# Patient Record
Sex: Female | Born: 1980 | Race: White | Hispanic: No | Marital: Single | Smoking: Current every day smoker
Health system: Southern US, Community
[De-identification: ages and names within clinical notes are randomized; demographics above are authoritative.]

## PROBLEM LIST (undated history)

## (undated) DIAGNOSIS — N390 Urinary tract infection, site not specified: Secondary | ICD-10-CM

## (undated) HISTORY — PX: WISDOM TOOTH EXTRACTION: SHX21

## (undated) HISTORY — PX: APPENDECTOMY: SHX54

## (undated) HISTORY — DX: Urinary tract infection, site not specified: N39.0

---

## 2008-07-12 ENCOUNTER — Ambulatory Visit: Payer: Self-pay | Admitting: Emergency Medicine

## 2013-07-07 ENCOUNTER — Emergency Department: Payer: Self-pay | Admitting: Emergency Medicine

## 2014-10-30 ENCOUNTER — Encounter (INDEPENDENT_AMBULATORY_CARE_PROVIDER_SITE_OTHER): Payer: Self-pay

## 2014-10-30 ENCOUNTER — Ambulatory Visit (INDEPENDENT_AMBULATORY_CARE_PROVIDER_SITE_OTHER): Payer: BLUE CROSS/BLUE SHIELD | Admitting: Nurse Practitioner

## 2014-10-30 ENCOUNTER — Encounter: Payer: Self-pay | Admitting: Nurse Practitioner

## 2014-10-30 ENCOUNTER — Other Ambulatory Visit (HOSPITAL_COMMUNITY)
Admission: RE | Admit: 2014-10-30 | Discharge: 2014-10-30 | Disposition: A | Discharge status: Home / Self Care | Payer: BLUE CROSS/BLUE SHIELD | Source: Ambulatory Visit | Attending: Nurse Practitioner | Admitting: Nurse Practitioner

## 2014-10-30 VITALS — BP 110/80 | HR 75 | Temp 97.7°F | Ht 67.0 in | Wt 207.8 lb

## 2014-10-30 DIAGNOSIS — Z01419 Encounter for gynecological examination (general) (routine) without abnormal findings: Secondary | ICD-10-CM | POA: Diagnosis not present

## 2014-10-30 DIAGNOSIS — Z1151 Encounter for screening for human papillomavirus (HPV): Secondary | ICD-10-CM | POA: Insufficient documentation

## 2014-10-30 DIAGNOSIS — Z23 Encounter for immunization: Secondary | ICD-10-CM

## 2014-10-30 DIAGNOSIS — F411 Generalized anxiety disorder: Secondary | ICD-10-CM | POA: Insufficient documentation

## 2014-10-30 DIAGNOSIS — Z1329 Encounter for screening for other suspected endocrine disorder: Secondary | ICD-10-CM | POA: Diagnosis not present

## 2014-10-30 DIAGNOSIS — Z131 Encounter for screening for diabetes mellitus: Secondary | ICD-10-CM

## 2014-10-30 DIAGNOSIS — Z130 Encounter for screening for diseases of the blood and blood-forming organs and certain disorders involving the immune mechanism: Secondary | ICD-10-CM

## 2014-10-30 DIAGNOSIS — Z0000 Encounter for general adult medical examination without abnormal findings: Secondary | ICD-10-CM

## 2014-10-30 DIAGNOSIS — Z7689 Persons encountering health services in other specified circumstances: Secondary | ICD-10-CM | POA: Insufficient documentation

## 2014-10-30 DIAGNOSIS — Z13220 Encounter for screening for lipoid disorders: Secondary | ICD-10-CM

## 2014-10-30 LAB — CBC WITH DIFFERENTIAL/PLATELET
BASOS ABS: 0 10*3/uL (ref 0.0–0.1)
Basophils Relative: 0.5 % (ref 0.0–3.0)
EOS PCT: 2.4 % (ref 0.0–5.0)
Eosinophils Absolute: 0.1 10*3/uL (ref 0.0–0.7)
HEMATOCRIT: 37.3 % (ref 36.0–46.0)
Hemoglobin: 12.8 g/dL (ref 12.0–15.0)
Lymphocytes Relative: 34.7 % (ref 12.0–46.0)
Lymphs Abs: 2.1 10*3/uL (ref 0.7–4.0)
MCHC: 34.3 g/dL (ref 30.0–36.0)
MCV: 90.3 fl (ref 78.0–100.0)
MONO ABS: 0.4 10*3/uL (ref 0.1–1.0)
Monocytes Relative: 7.3 % (ref 3.0–12.0)
NEUTROS PCT: 55.1 % (ref 43.0–77.0)
Neutro Abs: 3.4 10*3/uL (ref 1.4–7.7)
PLATELETS: 314 10*3/uL (ref 150.0–400.0)
RBC: 4.12 Mil/uL (ref 3.87–5.11)
RDW: 14.3 % (ref 11.5–15.5)
WBC: 6.1 10*3/uL (ref 4.0–10.5)

## 2014-10-30 LAB — COMPREHENSIVE METABOLIC PANEL
ALBUMIN: 4.3 g/dL (ref 3.5–5.2)
ALT: 44 U/L — ABNORMAL HIGH (ref 0–35)
AST: 40 U/L — AB (ref 0–37)
Alkaline Phosphatase: 81 U/L (ref 39–117)
BUN: 11 mg/dL (ref 6–23)
CO2: 24 meq/L (ref 19–32)
CREATININE: 0.81 mg/dL (ref 0.40–1.20)
Calcium: 9.2 mg/dL (ref 8.4–10.5)
Chloride: 105 mEq/L (ref 96–112)
GFR: 87.26 mL/min (ref >60.00)
GLUCOSE: 86 mg/dL (ref 70–99)
Potassium: 3.9 mEq/L (ref 3.5–5.1)
Sodium: 138 mEq/L (ref 135–145)
Total Bilirubin: 0.3 mg/dL (ref 0.2–1.2)
Total Protein: 7.6 g/dL (ref 6.0–8.3)

## 2014-10-30 LAB — LIPID PANEL
CHOL/HDL RATIO: 4
CHOLESTEROL: 275 mg/dL — AB (ref 0–200)
HDL: 63.1 mg/dL (ref >39.00)
NonHDL: 211.9
Triglycerides: 296 mg/dL — ABNORMAL HIGH (ref 0.0–149.0)
VLDL: 59.2 mg/dL — ABNORMAL HIGH (ref 0.0–40.0)

## 2014-10-30 LAB — TSH: TSH: 1.41 u[IU]/mL (ref 0.35–4.50)

## 2014-10-30 LAB — LDL CHOLESTEROL, DIRECT: Direct LDL: 145 mg/dL

## 2014-10-30 LAB — HEMOGLOBIN A1C: Hgb A1c MFr Bld: 5.5 % (ref 4.6–6.5)

## 2014-10-30 MED ORDER — PREDNISONE 10 MG (21) PO TBPK
10.0000 mg | ORAL_TABLET | Freq: Every day | ORAL | Status: DC
Start: 2014-10-30 — End: 2015-09-28

## 2014-10-30 NOTE — Progress Notes (Signed)
Subjective:    Patient ID: Stacey Moreno, female    DOB: 12/25/1980, 34 y.o.   MRN: [id number]  HPI  Ms. Stacey Moreno is a 34 yo female establishing care.   1) Health Maintenance-   Diet- Regular   Exercise- No formal, stays active  Immunizations- Needs tdap   Pap- 10 years ago   Eye Exam- Not UTD  Dental Exam- 04/16, normal   LMP- Last week 10/27/14, lasted 5 days normal for pt   Last Labs- 2008   2) Chronic Problems-  Anxiety- Well controlled on Zoloft, 2 years   3) Acute Problems-  Denies acute problems   Review of Systems  Constitutional: Negative for fever, chills, diaphoresis and fatigue.  HENT: Negative for tinnitus and trouble swallowing.   Eyes: Negative for visual disturbance.  Respiratory: Negative for chest tightness, shortness of breath and wheezing.   Cardiovascular: Negative for chest pain, palpitations and leg swelling.  Gastrointestinal: Negative for nausea, vomiting, diarrhea and constipation.  Genitourinary: Negative for difficulty urinating.  Musculoskeletal: Negative for back pain and neck pain.  Skin: Negative for rash.  Neurological: Negative for dizziness, weakness, numbness and headaches.  Hematological: Does not bruise/bleed easily.  Psychiatric/Behavioral: Negative for suicidal ideas and sleep disturbance. The patient is not nervous/anxious.    Past Medical History  Diagnosis Date  . UTI (urinary tract infection)     H/O one    History   Social History  . Marital Status: Single    Spouse Name: N/A  . Number of Children: N/A  . Years of Education: N/A   Occupational History  . Not on file.   Social History Main Topics  . Smoking status: Former Smoker    Types: Cigarettes    Quit date: 06/30/2010  . Smokeless tobacco: Not on file  . Alcohol Use: 6.0 oz/week    10 Cans of beer per week  . Drug Use: No  . Sexual Activity: Not Currently   Other Topics Concern  . 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  Family History  Problem Relation Age of Onset  . Alcohol abuse Father   . Alcohol abuse Maternal Aunt   . Cancer Paternal Aunt     breast  . Cancer Maternal Grandmother     breast  . Cancer Paternal Grandfather     lung    No Known Allergies  No current outpatient prescriptions on file prior to visit.   No current facility-administered medications on file prior to visit.      Objective:   Physical Exam  Constitutional: She is oriented to person, place, and time. She appears well-developed and well-nourished. No distress.  BP 110/80 mmHg  Pulse 75  Temp(Src) 97.7 F (36.5 C) (Oral)  Ht 5\' 7"  (1.702 m)  Wt 207 lb 12 oz (94.235 kg)  BMI 32.53 kg/m2  SpO2 97%  LMP 10/27/2014 (Approximate)   HENT:  Head: Normocephalic and atraumatic.  Right Ear: External ear normal.  Left Ear: External ear normal.  Eyes: EOM are normal. Pupils are equal, round, and reactive to light. Right eye exhibits no discharge. Left eye exhibits no discharge. No scleral icterus.  Neck: Normal  range of motion. Neck supple. No thyromegaly present.  Cardiovascular: Normal rate, regular rhythm, normal heart sounds and intact distal pulses.  Exam reveals no gallop and no friction rub.   No murmur heard. Pulmonary/Chest: Effort normal and breath sounds normal. No respiratory distress. She has no wheezes. She has no rales. She exhibits no tenderness.  Normal breast exam  Abdominal: Soft. Bowel sounds are normal. She exhibits no distension and no mass. There is no tenderness. There is no rebound and no guarding.  Obese abdomen  Genitourinary: Vagina normal and uterus normal. Guaiac negative stool. No vaginal discharge found.  Musculoskeletal: Normal range of motion. She exhibits no edema or tenderness.  Lymphadenopathy:     She has no cervical adenopathy.  Neurological: She is alert and oriented to person, place, and time. No cranial nerve deficit. She exhibits normal muscle tone. Coordination normal.  Skin: Skin is warm and dry. No rash noted. She is not diaphoretic.  Psychiatric: She has a normal mood and affect. Her behavior is normal. Judgment and thought content normal.      Assessment & Plan:

## 2014-10-30 NOTE — Addendum Note (Signed)
Addended by: Karlene Einstein D on: 10/30/2014 04:34 PM   Modules accepted: Orders

## 2014-10-30 NOTE — Assessment & Plan Note (Signed)
Seeing Dr. Nicolasa Ducking. Would like for [place] to fill Zoloft. Dr. Nicolasa Ducking referred her to [place].

## 2014-10-30 NOTE — Assessment & Plan Note (Signed)
New pt. No CC so turned into physical exam

## 2014-10-30 NOTE — Patient Instructions (Signed)

## 2014-10-30 NOTE — Progress Notes (Signed)
Pre visit review using our clinic review tool, if applicable. No additional management support is needed unless otherwise documented below in the visit note. 

## 2014-10-30 NOTE — Assessment & Plan Note (Signed)
Discussed acute and chronic issues. Reviewed health maintenance measures, PFSHx, and immunizations. Obtain routine labs TSH, Lipid panel, CBC w/ diff, A1c, and CMET.   Tdap given today.

## 2014-10-30 NOTE — Assessment & Plan Note (Signed)
PAP and breast exam done today. Normal findings

## 2014-11-01 ENCOUNTER — Other Ambulatory Visit: Payer: Self-pay | Admitting: Nurse Practitioner

## 2014-11-01 DIAGNOSIS — Z79899 Other long term (current) drug therapy: Secondary | ICD-10-CM

## 2014-11-01 LAB — CYTOLOGY - PAP

## 2014-11-01 MED ORDER — ATORVASTATIN CALCIUM 10 MG PO TABS: 10.0000 mg | ORAL_TABLET | Freq: Every day | ORAL | Status: DC

## 2014-11-30 ENCOUNTER — Ambulatory Visit: Payer: BLUE CROSS/BLUE SHIELD | Admitting: Nurse Practitioner

## 2014-12-01 ENCOUNTER — Telehealth: Payer: Self-pay | Admitting: Nurse Practitioner

## 2014-12-01 ENCOUNTER — Ambulatory Visit: Payer: BLUE CROSS/BLUE SHIELD | Admitting: Nurse Practitioner

## 2014-12-01 NOTE — Telephone Encounter (Signed)
Thank you. Okay to reschedule.

## 2014-12-01 NOTE — Telephone Encounter (Signed)
Pt called to cancel appt due to the passing of her father. Please advise pt to resch/msn

## 2015-01-28 ENCOUNTER — Other Ambulatory Visit: Payer: Self-pay | Admitting: Nurse Practitioner

## 2015-05-03 ENCOUNTER — Other Ambulatory Visit: Payer: Self-pay | Admitting: Nurse Practitioner

## 2015-06-27 ENCOUNTER — Ambulatory Visit: Payer: Self-pay | Admitting: Physician Assistant

## 2015-09-28 ENCOUNTER — Encounter: Payer: Self-pay | Admitting: Physician Assistant

## 2015-09-28 ENCOUNTER — Ambulatory Visit: Payer: Self-pay | Admitting: Physician Assistant

## 2015-09-28 VITALS — BP 125/80 | HR 76 | Temp 98.5°F

## 2015-09-28 DIAGNOSIS — Z299 Encounter for prophylactic measures, unspecified: Secondary | ICD-10-CM

## 2015-09-28 DIAGNOSIS — K529 Noninfective gastroenteritis and colitis, unspecified: Secondary | ICD-10-CM

## 2015-09-28 DIAGNOSIS — K219 Gastro-esophageal reflux disease without esophagitis: Secondary | ICD-10-CM

## 2015-09-28 DIAGNOSIS — R748 Abnormal levels of other serum enzymes: Secondary | ICD-10-CM

## 2015-09-28 MED ORDER — OMEPRAZOLE 20 MG PO CPDR: 20.0000 mg | DELAYED_RELEASE_CAPSULE | Freq: Every day | ORAL | Status: DC

## 2015-09-28 NOTE — Progress Notes (Signed)
S: having chronic diarrhea, stomach gets upset a lot, sx for about a year, no blood or mucus in the stool, no fever/chills, also having reflux problems, a lot of heart burn, states does still have a gallbladder, denies abd pain, just cramping with diarrhea, mother has IBS  O: vitals wnl, nad, abd soft nontender bs normal all 4 quads, n/v intact  A: chronic diarrhea, gerd  P: omeprazole 20mg  qd, otc probiotic, will refer to GI, yearly labs drawn today

## 2015-09-29 LAB — CMP12+LP+TP+TSH+6AC+CBC/D/PLT
A/G RATIO: 1.7 (ref 1.2–2.2)
ALT: 86 IU/L — AB (ref 0–32)
AST: 93 IU/L — ABNORMAL HIGH (ref 0–40)
Albumin: 4.5 g/dL (ref 3.5–5.5)
Alkaline Phosphatase: 88 IU/L (ref 39–117)
BASOS ABS: 0 10*3/uL (ref 0.0–0.2)
BUN/Creatinine Ratio: 14 (ref 8–20)
BUN: 10 mg/dL (ref 6–20)
Basos: 1 %
Bilirubin Total: <0.2 mg/dL (ref 0.0–1.2)
CALCIUM: 9.5 mg/dL (ref 8.7–10.2)
Chloride: 96 mmol/L (ref 96–106)
Chol/HDL Ratio: 5.8 ratio units — ABNORMAL HIGH (ref 0.0–4.4)
Cholesterol, Total: 272 mg/dL — ABNORMAL HIGH (ref 100–199)
Creatinine, Ser: 0.69 mg/dL (ref 0.57–1.00)
EOS (ABSOLUTE): 0.1 10*3/uL (ref 0.0–0.4)
Eos: 1 %
Estimated CHD Risk: 1.6 times avg. — ABNORMAL HIGH (ref 0.0–1.0)
Free Thyroxine Index: 1.5 (ref 1.2–4.9)
GFR calc non Af Amer: 116 mL/min/{1.73_m2} (ref >59)
GFR, EST AFRICAN AMERICAN: 133 mL/min/{1.73_m2} (ref >59)
GGT: 231 IU/L — ABNORMAL HIGH (ref 0–60)
GLOBULIN, TOTAL: 2.6 g/dL (ref 1.5–4.5)
Glucose: 92 mg/dL (ref 65–99)
HDL: 47 mg/dL (ref >39)
Hematocrit: 40.2 % (ref 34.0–46.6)
Hemoglobin: 13 g/dL (ref 11.1–15.9)
IRON: 99 ug/dL (ref 27–159)
Immature Grans (Abs): 0 10*3/uL (ref 0.0–0.1)
Immature Granulocytes: 0 %
LDH: 241 IU/L — AB (ref 119–226)
LYMPHS: 28 %
Lymphocytes Absolute: 2 10*3/uL (ref 0.7–3.1)
MCH: 30.7 pg (ref 26.6–33.0)
MCHC: 32.3 g/dL (ref 31.5–35.7)
MCV: 95 fL (ref 79–97)
MONOS ABS: 0.4 10*3/uL (ref 0.1–0.9)
Monocytes: 6 %
Neutrophils Absolute: 4.7 10*3/uL (ref 1.4–7.0)
Neutrophils: 64 %
PLATELETS: 287 10*3/uL (ref 150–379)
Phosphorus: 3.1 mg/dL (ref 2.5–4.5)
Potassium: 4.1 mmol/L (ref 3.5–5.2)
RBC: 4.24 x10E6/uL (ref 3.77–5.28)
RDW: 14.1 % (ref 12.3–15.4)
Sodium: 137 mmol/L (ref 134–144)
T3 UPTAKE RATIO: 26 % (ref 24–39)
T4 TOTAL: 5.6 ug/dL (ref 4.5–12.0)
TOTAL PROTEIN: 7.1 g/dL (ref 6.0–8.5)
TRIGLYCERIDES: 827 mg/dL — AB (ref 0–149)
TSH: 1.53 u[IU]/mL (ref 0.450–4.500)
URIC ACID: 6.4 mg/dL (ref 2.5–7.1)
WBC: 7.3 10*3/uL (ref 3.4–10.8)

## 2015-09-29 LAB — HEMOGLOBIN A1C
ESTIMATED AVERAGE GLUCOSE: 111 mg/dL
Hgb A1c MFr Bld: 5.5 % (ref 4.8–5.6)

## 2015-09-29 LAB — VITAMIN D 25 HYDROXY (VIT D DEFICIENCY, FRACTURES): VIT D 25 HYDROXY: 14.4 ng/mL — AB (ref 30.0–100.0)

## 2015-10-01 NOTE — Addendum Note (Signed)
Addended by: Versie Starks on: 10/01/2015 02:54 PM   Modules accepted: Orders

## 2015-10-01 NOTE — Progress Notes (Signed)
Liver enzymes elevated with alert on triglycerides, pt has been having cramping and diarrhea for about a year, order [place] of abdomen and recheck liver enzymes and lipids this week

## 2015-10-01 NOTE — Progress Notes (Signed)
Patient ID: Stacey Moreno, female   DOB: 1980/09/08, 35 y.o.   MRN: [id number] Patient has been scheduled for an abdomen ultrasound on 4/3 at 8:30 in [place].  Pt has been contacted and has accepted the appointment.  I called [employer] to check if PA is needed for the Ultrasound..  Per Ardith Dark at [facility] she said that a prior authorization is not required.  If any questions call [phone].

## 2015-10-01 NOTE — Progress Notes (Signed)
I spoke with the patient about her lab results and she expressed understanding.  Pt will return tomorrow to recheck her liver enzymes and schedule an abdomen ultrasound.

## 2015-10-02 ENCOUNTER — Encounter: Payer: Self-pay | Admitting: Physician Assistant

## 2015-10-02 ENCOUNTER — Ambulatory Visit
Admission: RE | Admit: 2015-10-02 | Discharge: 2015-10-02 | Disposition: A | Discharge status: Home / Self Care | Payer: Managed Care, Other (non HMO) | Source: Ambulatory Visit | Attending: Physician Assistant | Admitting: Physician Assistant

## 2015-10-02 ENCOUNTER — Ambulatory Visit: Payer: Self-pay | Admitting: Physician Assistant

## 2015-10-02 DIAGNOSIS — K529 Noninfective gastroenteritis and colitis, unspecified: Secondary | ICD-10-CM | POA: Diagnosis not present

## 2015-10-02 DIAGNOSIS — K760 Fatty (change of) liver, not elsewhere classified: Secondary | ICD-10-CM

## 2015-10-02 DIAGNOSIS — R748 Abnormal levels of other serum enzymes: Secondary | ICD-10-CM | POA: Insufficient documentation

## 2015-10-02 DIAGNOSIS — E781 Pure hyperglyceridemia: Secondary | ICD-10-CM

## 2015-10-02 NOTE — Progress Notes (Signed)
S: pt here for repeat lab draw of triglycerides and liver enzymes, had triglyceride level of 827 with elevated liver enzymes also, [place] of abdomen showed fatty liver disease, pt is being referred to GI for eval of chronic diarrhea, pt does state she has been drinking a lot of etoh and eating badly, has gained weight also,  O: neuro intact, lab review  A: lab review, [place] review  P: discussed cessation of any etoh, her father was an alcoholic and alcoholism also runs on her mother's side of the family, discussed fatty liver disease, causes and sequelae, went over heart healthy diet options , will review repeat labs tomorrow and discuss options of statin therapy with dr Rosanna Randy

## 2015-10-02 NOTE — Addendum Note (Signed)
Addended by: Rudene Anda T on: 10/02/2015 03:09 PM   Modules accepted: Orders

## 2015-10-03 ENCOUNTER — Telehealth: Payer: Self-pay | Admitting: Gastroenterology

## 2015-10-03 ENCOUNTER — Other Ambulatory Visit: Payer: Self-pay | Admitting: Physician Assistant

## 2015-10-03 LAB — HEPATIC FUNCTION PANEL
ALT: 98 IU/L — AB (ref 0–32)
AST: 100 IU/L — ABNORMAL HIGH (ref 0–40)
Albumin: 4.6 g/dL (ref 3.5–5.5)
Alkaline Phosphatase: 83 IU/L (ref 39–117)
BILIRUBIN TOTAL: 0.3 mg/dL (ref 0.0–1.2)
Bilirubin, Direct: 0.09 mg/dL (ref 0.00–0.40)
TOTAL PROTEIN: 7 g/dL (ref 6.0–8.5)

## 2015-10-03 LAB — HCV COMMENT:

## 2015-10-03 LAB — LIPID PANEL
Chol/HDL Ratio: 3.6 ratio units (ref 0.0–4.4)
Cholesterol, Total: 233 mg/dL — ABNORMAL HIGH (ref 100–199)
HDL: 64 mg/dL (ref >39)
LDL Calculated: 110 mg/dL — ABNORMAL HIGH (ref 0–99)
Triglycerides: 296 mg/dL — ABNORMAL HIGH (ref 0–149)
VLDL Cholesterol Cal: 59 mg/dL — ABNORMAL HIGH (ref 5–40)

## 2015-10-03 LAB — HEPATITIS C ANTIBODY (REFLEX)

## 2015-10-03 MED ORDER — PRAVASTATIN SODIUM 20 MG PO TABS: 20.0000 mg | ORAL_TABLET | Freq: Every day | ORAL | Status: DC

## 2015-10-03 NOTE — Progress Notes (Signed)
Sent in rx for pravastatin 20mg , discussed with dr Rosanna Randy and he recommended this medication with recheck of liver enzymes and lipids in one month

## 2015-10-03 NOTE — Progress Notes (Signed)
I spoke to the patient about her lab results and she expressed understanding.  Patient will return in 3 months to recheck her Liver enzymes and her Lipid panel per Susan's authorization.

## 2015-10-03 NOTE — Telephone Encounter (Signed)
Left voice message for patient to call and schedule with Dr. Allen Norris for chronic diarrhea, reflux, and elevated liver enzymes

## 2015-10-09 ENCOUNTER — Telehealth: Payer: Self-pay | Admitting: Gastroenterology

## 2015-10-09 NOTE — Telephone Encounter (Signed)
Left voice message for patient to call and schedule an appointment with Dr. Allen Norris for chronic diarrhea. 2nd attempt

## 2015-10-17 ENCOUNTER — Telehealth: Payer: Self-pay | Admitting: Gastroenterology

## 2015-10-17 NOTE — Telephone Encounter (Signed)
Called patient and left message on 4/5, 4/11 and 4/19 to schedule appointment with Dr. Allen Norris for chronic diarrhea

## 2015-11-06 ENCOUNTER — Ambulatory Visit: Payer: Self-pay | Admitting: Physician Assistant

## 2015-11-28 ENCOUNTER — Encounter: Payer: Self-pay | Admitting: Physician Assistant

## 2015-11-28 ENCOUNTER — Ambulatory Visit: Payer: Self-pay | Admitting: Physician Assistant

## 2015-11-28 VITALS — BP 120/80 | HR 77 | Temp 98.4°F

## 2015-11-28 DIAGNOSIS — J029 Acute pharyngitis, unspecified: Secondary | ICD-10-CM

## 2015-11-28 NOTE — Progress Notes (Signed)
S: c/o sore throat, runny nose, dry cough started this am, sore throat for 2 days, no fever/chills, just got back from [place], ?if its from climate change; ?pt about chronic diarrhea and states the probiotic has really helped her and has cut back on etoh so diarrhea has subsided  O: vitals wnl, nad, tms dull, nasal mucosa wnl, throat w red area at uvula only, neck supple no lymph, lungs c t a, cv rrr  A: acute viral pharyngitis  P: reassurance, warm salt water gargles, if worsening will call in antibiotic

## 2016-02-12 ENCOUNTER — Other Ambulatory Visit: Payer: Self-pay | Admitting: Physician Assistant

## 2016-02-12 NOTE — Telephone Encounter (Signed)
Med refill approved, will need fasting labs to check lipid profile and liver enzymes prior to additional refills

## 2016-03-18 ENCOUNTER — Ambulatory Visit: Payer: Self-pay | Admitting: Physician Assistant

## 2016-03-19 ENCOUNTER — Ambulatory Visit: Payer: Self-pay | Admitting: Physician Assistant

## 2016-05-02 ENCOUNTER — Ambulatory Visit: Payer: Self-pay | Admitting: Physician Assistant

## 2016-05-02 ENCOUNTER — Other Ambulatory Visit
Admission: RE | Admit: 2016-05-02 | Discharge: 2016-05-02 | Disposition: A | Discharge status: Home / Self Care | Payer: Managed Care, Other (non HMO) | Source: Ambulatory Visit | Attending: Physician Assistant | Admitting: Physician Assistant

## 2016-05-02 ENCOUNTER — Encounter: Payer: Self-pay | Admitting: Physician Assistant

## 2016-05-02 VITALS — BP 122/80 | HR 93 | Temp 98.7°F

## 2016-05-02 DIAGNOSIS — F1010 Alcohol abuse, uncomplicated: Secondary | ICD-10-CM | POA: Diagnosis present

## 2016-05-02 LAB — CBC WITH DIFFERENTIAL/PLATELET
BASOS ABS: 0 10*3/uL (ref 0–0.1)
BASOS PCT: 0 %
EOS ABS: 0.1 10*3/uL (ref 0–0.7)
EOS PCT: 1 %
HCT: 37.1 % (ref 35.0–47.0)
Hemoglobin: 12.9 g/dL (ref 12.0–16.0)
Lymphocytes Relative: 19 %
Lymphs Abs: 1.6 10*3/uL (ref 1.0–3.6)
MCH: 33.9 pg (ref 26.0–34.0)
MCHC: 34.8 g/dL (ref 32.0–36.0)
MCV: 97.4 fL (ref 80.0–100.0)
Monocytes Absolute: 0.4 10*3/uL (ref 0.2–0.9)
Monocytes Relative: 5 %
NEUTROS PCT: 75 %
Neutro Abs: 6.2 10*3/uL (ref 1.4–6.5)
PLATELETS: 196 10*3/uL (ref 150–440)
RBC: 3.8 MIL/uL (ref 3.80–5.20)
RDW: 15.8 % — ABNORMAL HIGH (ref 11.5–14.5)
WBC: 8.4 10*3/uL (ref 3.6–11.0)

## 2016-05-02 LAB — COMPREHENSIVE METABOLIC PANEL
ALT: 32 U/L (ref 14–54)
AST: 46 U/L — ABNORMAL HIGH (ref 15–41)
Albumin: 4 g/dL (ref 3.5–5.0)
Alkaline Phosphatase: 66 U/L (ref 38–126)
Anion gap: 7 (ref 5–15)
BUN: 9 mg/dL (ref 6–20)
CHLORIDE: 106 mmol/L (ref 101–111)
CO2: 23 mmol/L (ref 22–32)
CREATININE: 0.54 mg/dL (ref 0.44–1.00)
Calcium: 9.3 mg/dL (ref 8.9–10.3)
Glucose, Bld: 121 mg/dL — ABNORMAL HIGH (ref 65–99)
POTASSIUM: 3.8 mmol/L (ref 3.5–5.1)
Sodium: 136 mmol/L (ref 135–145)
Total Bilirubin: 0.5 mg/dL (ref 0.3–1.2)
Total Protein: 7.4 g/dL (ref 6.5–8.1)

## 2016-05-02 LAB — TSH: TSH: 2.85 u[IU]/mL (ref 0.350–4.500)

## 2016-05-02 LAB — URINE DRUG SCREEN, QUALITATIVE (ARMC ONLY)
Amphetamines, Ur Screen: NOT DETECTED
Barbiturates, Ur Screen: POSITIVE — AB
Benzodiazepine, Ur Scrn: NOT DETECTED
CANNABINOID 50 NG, UR ~~LOC~~: NOT DETECTED
COCAINE METABOLITE, UR ~~LOC~~: NOT DETECTED
MDMA (ECSTASY) UR SCREEN: NOT DETECTED
Methadone Scn, Ur: NOT DETECTED
Opiate, Ur Screen: NOT DETECTED
Phencyclidine (PCP) Ur S: NOT DETECTED
TRICYCLIC, UR SCREEN: NOT DETECTED

## 2016-05-02 LAB — LIPID PANEL
CHOLESTEROL: 180 mg/dL (ref 0–200)
HDL: 51 mg/dL (ref >40)
LDL Cholesterol: UNDETERMINED mg/dL (ref 0–99)
TRIGLYCERIDES: 470 mg/dL — AB (ref <150)
Total CHOL/HDL Ratio: 3.5 RATIO
VLDL: UNDETERMINED mg/dL (ref 0–40)

## 2016-05-02 LAB — ETHANOL: Alcohol, Ethyl (B): <5 mg/dL (ref <5)

## 2016-05-02 NOTE — Progress Notes (Addendum)
S: wants to talk about detox, states she knows she's an alcoholic, has strong fam hx of alcoholism, recently was reprimanded at work due to having etoh on her breath, blew a .08 , states she knows she needs to work on this, her last drink was Wednesday night, hasn't had any vomiting or tremors since then, states she was drinking 1/2 of a fifth of liquor + beer every day, denies other drug use, recently started smoking again, has family support to help her with rehab, father died of complications from alcoholism, her mother's side of the family has strong history of etoh abuse, + smoker  O:  Vitals wnl, nad, lungs c t a, cv rrr, neuro intact  A: etoh abuse  P: medical clearance with labs, pt sent to [facility] for detox  Pt called, [facility] sent her home with a librium taper, is doing well but needs a name of a counselor, recommended Patton Salles at [facility] therapy or [facility]

## 2016-05-14 ENCOUNTER — Ambulatory Visit (HOSPITAL_COMMUNITY): Payer: Managed Care, Other (non HMO) | Admitting: Psychology

## 2016-05-14 DIAGNOSIS — F1020 Alcohol dependence, uncomplicated: Secondary | ICD-10-CM

## 2016-05-14 NOTE — Progress Notes (Signed)
Orientation to CD-IOP: The patient is a 35 yo single, white, female seeing entry into the CD-IOP. The Chief of Personnel at the [facility] Department referred her to our program. The patient reported she had gone to work on November 1 and someone smelled alcohol on her. A BAC was collected and it registered .08. The patient was temporarily out of work and her PCP referred her to the hospital in [place] for an alcohol detox. They sent her home with a Librium taper and she completed it on Tuesday, November 7.  The patient reported she has had no alcohol since November 1. She will return to work tomorrow, but in a different position for the time being. Her new assignment will accommodate any hours she needs to miss in order to attend the group. The patient was very open about her alcohol use and stated, "I am an alcoholic". She expressed relief that this problem is finally going to be addressed. The patient reported she first drank at age 39 and began drinking more frequently when she entered college at [employer] and played on the softball team. Drinking was a significant part of her college experience and the patient admitted she has been drinking daily for 13 years. 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 The patient is an only child and was born and raised in [place], [place]. Despite both of her parents being alcoholics, the patient described a happy and loving childhood. She and her father were very close and he was very supportive of her athletic accomplishments. He died in January 13, 2015 "from drinking and other things". The patient never mourned his death and admitted she has used alcohol to numb the pain and losses in her life. Her three maternal aunts are also chemically dependent. The oldest died of an opiate overdose (using fentanyl patches) while another has  been sober for 20+ years. The patient knows little of her family history.  She explained that her maternal grandmother killed herself and all four daughters grew up in an orphanage. Her mother refuses to talk about her childhood. She currently lives in [place] with her mother. The patient reported she had been in a toxic relationship with a woman for 5 years. The S/O was manipulative, accused the patient of robbing the home, lied to the police, and was able to secure a restraining order, which is good for the next year. 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 The patient was very revealing and cooperative throughout the session, but admitted she did not typically share her feelings and was disclosing more today in this session than she ever had. She admitted being a "people pleaser" and a stuffer. 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 She understood that this way of dealing with feelings and pain would be address in this program and seemed to welcome it. The documentation was reviewed, signed and the orientation and CCA completed accordingly. The patient will return tomorrow and begin the CD-IOP.

## 2016-05-15 ENCOUNTER — Telehealth (HOSPITAL_COMMUNITY): Payer: Self-pay | Admitting: Psychology

## 2016-05-15 ENCOUNTER — Encounter (HOSPITAL_COMMUNITY): Payer: Self-pay | Admitting: Psychology

## 2016-05-15 ENCOUNTER — Other Ambulatory Visit (HOSPITAL_COMMUNITY): Payer: Managed Care, Other (non HMO) | Attending: Psychiatry | Admitting: Psychology

## 2016-05-15 DIAGNOSIS — K2900 Acute gastritis without bleeding: Secondary | ICD-10-CM | POA: Insufficient documentation

## 2016-05-15 DIAGNOSIS — F4310 Post-traumatic stress disorder, unspecified: Secondary | ICD-10-CM | POA: Diagnosis not present

## 2016-05-15 DIAGNOSIS — Z0001 Encounter for general adult medical examination with abnormal findings: Secondary | ICD-10-CM | POA: Diagnosis present

## 2016-05-15 DIAGNOSIS — K2920 Alcoholic gastritis without bleeding: Secondary | ICD-10-CM | POA: Insufficient documentation

## 2016-05-15 DIAGNOSIS — E669 Obesity, unspecified: Secondary | ICD-10-CM | POA: Diagnosis not present

## 2016-05-15 DIAGNOSIS — F10230 Alcohol dependence with withdrawal, uncomplicated: Secondary | ICD-10-CM | POA: Insufficient documentation

## 2016-05-15 DIAGNOSIS — K7010 Alcoholic hepatitis without ascites: Secondary | ICD-10-CM | POA: Insufficient documentation

## 2016-05-15 DIAGNOSIS — Z6830 Body mass index (BMI) 30.0-30.9, adult: Secondary | ICD-10-CM | POA: Diagnosis not present

## 2016-05-15 DIAGNOSIS — F17210 Nicotine dependence, cigarettes, uncomplicated: Secondary | ICD-10-CM | POA: Diagnosis not present

## 2016-05-15 DIAGNOSIS — E781 Pure hyperglyceridemia: Secondary | ICD-10-CM | POA: Diagnosis not present

## 2016-05-15 DIAGNOSIS — F4329 Adjustment disorder with other symptoms: Secondary | ICD-10-CM | POA: Diagnosis not present

## 2016-05-15 DIAGNOSIS — F19980 Other psychoactive substance use, unspecified with psychoactive substance-induced anxiety disorder: Secondary | ICD-10-CM | POA: Insufficient documentation

## 2016-05-15 DIAGNOSIS — F1020 Alcohol dependence, uncomplicated: Secondary | ICD-10-CM

## 2016-05-15 DIAGNOSIS — F411 Generalized anxiety disorder: Secondary | ICD-10-CM | POA: Insufficient documentation

## 2016-05-19 ENCOUNTER — Other Ambulatory Visit (HOSPITAL_COMMUNITY): Payer: Managed Care, Other (non HMO) | Admitting: Psychology

## 2016-05-19 DIAGNOSIS — F1020 Alcohol dependence, uncomplicated: Secondary | ICD-10-CM

## 2016-05-19 DIAGNOSIS — Z0001 Encounter for general adult medical examination with abnormal findings: Secondary | ICD-10-CM | POA: Diagnosis not present

## 2016-05-20 ENCOUNTER — Encounter (HOSPITAL_COMMUNITY): Payer: Self-pay | Admitting: Psychology

## 2016-05-21 ENCOUNTER — Other Ambulatory Visit (HOSPITAL_COMMUNITY): Payer: Managed Care, Other (non HMO) | Admitting: Psychology

## 2016-05-21 ENCOUNTER — Encounter (HOSPITAL_COMMUNITY): Payer: Self-pay | Admitting: Psychology

## 2016-05-21 DIAGNOSIS — F1020 Alcohol dependence, uncomplicated: Secondary | ICD-10-CM

## 2016-05-21 DIAGNOSIS — Z0001 Encounter for general adult medical examination with abnormal findings: Secondary | ICD-10-CM | POA: Diagnosis not present

## 2016-05-21 NOTE — Progress Notes (Signed)
    Daily Group Progress Note  Program: CD-IOP   05/21/2016 Netta Cedars [id number]  Diagnosis:  No diagnosis found.   Sobriety Date: 11/20  Group Time: 1-2:30 pm  Participation Level: Active  Behavioral Response: Appropriate and Sharing  Type of Therapy: Process Group  Interventions: Supportive  Topic: Process: Counselors met with patients to discuss recovery-related activities they had engaged in to support their sobriety. Two group members shared that they had returned to use. A new patient introduced himself to the group and shared reasons for pursuing treatment. All members were active and engaged in the discussion concerning recovery from mind-altering drugs and alcohol.  Group Time: 2:30-4 pm  Participation Level: Active  Behavioral Response: Sharing  Type of Therapy: Psycho-education Group  Interventions: Solution Focused  Topic: Psychoeducation: Counselors facilitated a discussion on relapse-prevention techniques. Group members filled out and discussed a checklist of situations they had encountered during their recovery. Information was provided regarding 12-step meetings during the holiday season. The counselors asked patients to share holiday plans as they related to recovery. A new group member met with the Investment banker, operational.    Summary: The patient presented as active and engaged in group. She shared that she had returned to use over the weekend and was not planning on sharing with the group. However, because another patient had been honest the patient decided to be as well. She reported that her return to use had been partially due to "routine" and partially driven out of a sense of "rebellion." In the past she has been used to hiding her use, so her reluctance to share fits into a larger pattern. The counselors encouraged her to continue being as honest as she could be. She also shared that she feared "missing out" by not using. The patient was unable to  attend any 12-step meetings.    UDS collected: No Results:  AA/NA attended?: No  Sponsor?: No   Brandon Melnick, LCAS 05/21/2016 6:28 PM

## 2016-05-25 ENCOUNTER — Encounter (HOSPITAL_COMMUNITY): Payer: Self-pay | Admitting: Psychology

## 2016-05-25 NOTE — Progress Notes (Signed)
    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 Group members described the things they had done since we last met to support their sobriety. They were also encouraged to share any thoughts of using, cravings or challenges to their recovery. The program director met with 2 group members and drug tests were collected from every member except for the member who was graduating today. During their check-ins, members shared briefly about their plans over the upcoming holiday weekend.   Group Time: 2:30-4 pm  Participation Level: Active  Behavioral Response: Sharing  Type of Therapy: Psycho-education Group  Interventions: Strength-based  Topic: Psycho-Ed: Relapse Prevention, Part 2/Graduation: the second half of group was spent in a psycho-ed on relapse prevention. It was a continuation of the psycho-ed on Monday. Members reviewed the handout provided during the last session and a discussion ensued on challenging thoughts of using in early recovery. Members shared openly about some of their ambivalence, which is to be expected. During the last 20 minutes of group today a graduation ceremony was held to honor a member who was completing the program. His wife had arrived and joined the celebration as did a member who had graduated last week. Kind words and brownies were shared as the session ended.   Summary: The patient was engaged and attentive in group today. 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 The patient shared that she had helped her mother move into her new apartment. She had set up her mother's computer and completed all of the electronic  needs for her. The patient reported she would spend thanksgiving with her mother. She admitted that her mother will be drinking beer the entire time, but, "it's not a trigger for me", the patient insisted. The patient explained that her mother and alcohol are basically synonymous and it is a natural part of her mother's life. It is all she has known fro early childhood - her mother always has a beer. The patient was encouraged to attend some meetings and other group members identified some speaker meetings over the weekend that they are going to. During the psycho-ed, the patient identified stress as a trigger, but agreed with another member, that having a few drinks (or more) at the end of the day is what she has been doing for years. She drinks whether she had a good day or a bad one. The patient wished the graduating member ongoing sobriety, but admitted she had only been with the group for 4 sessions. She made some good comments and responded well to this intervention. The patient stated her intention to attend meetings this weekend. If she doesn't we will have to review the requirements of the program.   UDS collected: Yes Results:pending  AA/NA attended?: No  Sponsor?: No   Brandon Melnick, LCAS 05/21/16 11:20 AM

## 2016-05-25 NOTE — Progress Notes (Signed)
    Daily Group Progress Note  Program: CD-IOP   Stacey Moreno [id number]  Diagnosis: F10.20  Sobriety Date: 11/2  Group Time: 1-2:30 pm  Participation Level: Active  Behavioral Response: Appropriate and Sharing  Type of Therapy: Process Group  Interventions: Supportive  Topic: Process: The first half of group was spent in process. Members discussed what they are doing to support their recovery and any challenges or obstacles that may have appeared since we last met. A new group member was present today and she introduced herself during this half of group.   Group Time: 2:30-4 PM  Participation Level: Active  Behavioral Response: Sharing  Type of Therapy: Activity Group  Interventions: Strength-based  Topic: Psycho-Ed; Emotional Jenga/Graduation: the second half of group was spent in a psycho-ed. The topic was "Emotional Jenga" and consisted of group members circling their chairs around a table with the Jenga tower in the middle. Members chose the pieces they drew and were asked to use them in a sentence and how they would address the emotion in recovery. There was good feedback and fun in this exercise. A graduation ceremony was held 20 minutes prior to the end of the session. Members enjoyed brownies and spoke to the graduating member. Her husband had arrived and was also present for this ceremony. She received validating loving compliments and wishes, and the ceremony proved very poignant.    Summary: The patient was new to the group and introduced herself. She described a long history of alcohol use that had been going on since she was in college. The patient shared that she had gone to work and someone smelled alcohol. A BAC was collected and it measured a .08 alcohol level. The patient had been sent home and was out of work for about 2 weeks. She reported she had gone to detox in [place], but they just sent her home with librium. She reported that she has not had any  alcohol since November 1. The patient also shared about her family and noted that her father died about 3 years ago, 'basically due to alcohol' while her mother is also an alcoholic. She is an only child and drinki   UDS collected: No Results:   AA/NA attended?: No ,but she is new to the group  Sponsor?: No   Ann Evans, LCAS 05/25/2016 12:12 PM 

## 2016-05-26 ENCOUNTER — Other Ambulatory Visit (INDEPENDENT_AMBULATORY_CARE_PROVIDER_SITE_OTHER): Payer: Managed Care, Other (non HMO) | Admitting: Psychology

## 2016-05-26 ENCOUNTER — Encounter (HOSPITAL_COMMUNITY): Payer: Self-pay | Admitting: Medical

## 2016-05-26 VITALS — BP 120/80 | HR 85 | Ht 67.0 in | Wt 207.0 lb

## 2016-05-26 DIAGNOSIS — F4310 Post-traumatic stress disorder, unspecified: Secondary | ICD-10-CM | POA: Diagnosis not present

## 2016-05-26 DIAGNOSIS — E781 Pure hyperglyceridemia: Secondary | ICD-10-CM

## 2016-05-26 DIAGNOSIS — F19980 Other psychoactive substance use, unspecified with psychoactive substance-induced anxiety disorder: Secondary | ICD-10-CM

## 2016-05-26 DIAGNOSIS — F1020 Alcohol dependence, uncomplicated: Secondary | ICD-10-CM | POA: Diagnosis not present

## 2016-05-26 DIAGNOSIS — E669 Obesity, unspecified: Secondary | ICD-10-CM

## 2016-05-26 DIAGNOSIS — F17200 Nicotine dependence, unspecified, uncomplicated: Secondary | ICD-10-CM

## 2016-05-26 DIAGNOSIS — F938 Other childhood emotional disorders: Secondary | ICD-10-CM

## 2016-05-26 DIAGNOSIS — K7010 Alcoholic hepatitis without ascites: Secondary | ICD-10-CM | POA: Insufficient documentation

## 2016-05-26 DIAGNOSIS — Z6372 Alcoholism and drug addiction in family: Secondary | ICD-10-CM

## 2016-05-26 DIAGNOSIS — K2920 Alcoholic gastritis without bleeding: Secondary | ICD-10-CM

## 2016-05-26 DIAGNOSIS — Z0001 Encounter for general adult medical examination with abnormal findings: Secondary | ICD-10-CM | POA: Diagnosis not present

## 2016-05-26 DIAGNOSIS — Z720 Tobacco use: Secondary | ICD-10-CM

## 2016-05-26 DIAGNOSIS — F4329 Adjustment disorder with other symptoms: Secondary | ICD-10-CM

## 2016-05-26 DIAGNOSIS — F411 Generalized anxiety disorder: Secondary | ICD-10-CM

## 2016-05-26 DIAGNOSIS — F4381 Prolonged grief disorder: Secondary | ICD-10-CM

## 2016-05-26 MED ORDER — TOPIRAMATE 50 MG PO TABS: 50.0000 mg | ORAL_TABLET | Freq: Every day | ORAL | 2 refills | Status: DC

## 2016-05-26 NOTE — Progress Notes (Signed)
Psychiatric Initial Adult Assessment   Patient Identification: ALINDA EGOLF MRN:  [id number] Date of Evaluation:  05/26/2016 Referral Source: Chief of [place] Dept.; Lorane Gell NP;Susan Nonie Hoyer [facility];  Chief Complaint:   Chief Complaint    Alcohol Problem; Stress; Trauma; Anxiety; Mood disorder     Visit Diagnosis:    ICD-9-CM ICD-10-CM   1. Alcohol use disorder, severe, dependence ([place]) 303.90 F10.20   2. PTSD (post-traumatic stress disorder) 309.81 F43.10   3. Alcoholism and drug addiction in family V61.41 Z63.72   4. GAD (generalized anxiety disorder) 300.02 F41.1   5. Substance-induced anxiety disorder ([place]) 292.89 F19.980    E980.5    6. Acute alcoholic hepatitis 606.3 K16.01   7. Acute alcoholic gastritis without hemorrhage 535.30 K29.20    Resolved with cessation of drinking  8. 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 Hypertriglyceridemia 272.1 E78.1   10. Smoker unmotivated to quit 305.1 F17.200   11. Smokeless tobacco use 305.1 Z72.0   12. Obesity, Class I, BMI 30.0-34.9 (see actual BMI) 278.00 E66.9    Subjective: "Its (alcoholism/anxiety /depression) a problem thruout my family-I've been dealing with it for so long.I  knew it was time to do something (about my drinking) "   History of Present Illness: 35 y/o WF referred by Employer after being reported by colleague with alcohol on her breath. She sought assistance of her primary care as follows:  Progress Notes by Versie Starks, PA-C at 05/02/2016 8:30 AM  Author: Versie Starks, PA-C Author Type: Physician Assistant Filed: 05/07/2016 1:19 PM  Note Status: Addendum Cosign: Cosign Not Required Encounter Date: 05/02/2016 8:30 AM  Editor: Versie Starks, PA-C (Physician Assistant)  Prior Versions: 1. Weldon Inches (Physician Assistant) at 05/02/2016 2:13 PM - Signed    S: wants to talk about detox, states she knows she's an alcoholic, has strong fam hx of  alcoholism, recently was reprimanded at work due to having etoh on her breath, blew a .08 , states she knows she needs to work on this, her last drink was Wednesday night, hasn't had any vomiting or tremors since then, states she was drinking 1/2 of a fifth of liquor + beer every day, denies other drug use, recently started smoking again, has family support to help her with rehab, father died of complications from alcoholism, her mother's side of the family has strong history of etoh abuse, + smoker  O:  Vitals wnl, nad, lungs c t a, cv rrr, neuro intact  A: etoh abuse  P: medical clearance with labs, pt sent to [facility] for detox  Pt called, [facility] sent her home with a librium taper, is doing well but needs a name of a counselor, recommended Patton Salles at [facility] therapy or [facility] outpatient treatment       Encounter Details  Date Type Department Care Team Description  05/02/2016 Emergency [facility] Emergency Department  [address]  [place], [place] [address]  [id number]  Althea Grimmer, MD  [address]  [id number]  [place], [place] [address]  [phone]  [phone] (Fax)  Alcohol withdrawal syndrome without complication ([facility]-HCC) (Primary Dx)  Netta Cedars. is a 35 y.o. female with no past medical history who presents requesting detox from alcohol. Patient is been drinking for the last 15 years, half of the fifth or 8-12 beers per day. 5 strength is 48 hours ago. No previous history of complex  withdrawal. Denies shakiness, seizures or hallucinations. On physical exam, patient appears comfortable. Vitals are reassuring. No evidence of complex withdrawal. We'll discharge home with Librium taper and follow-up referrals given. Pertinent labs & imaging results which were available during my care of the patient were reviewed by me. See chart and resident documentation for details. Final diagnoses:  Alcohol withdrawal syndrome without complication  ([facility]-HCC) (Primary)  Case management unfortunately unable to provide additional information. Will dc with Librium taper over next 3 days and return precatuions. Pt has been observed here without any withdrawal symptoms, will tx empirically with one tablet now and TID f/u by BID f/u once daily. Pt to follow up with [facility] for outpatient help Ane Payment May 07, 2016 9:31 PM   In April of this year she had presented to her PCP with c/o gastritis and diarrhea and was found to have elevated liver enzymes.GI Ultrasound was also performed reveling coarsened texture of liver but gas obscured further detail.GI consult was ordered but pt never made appointment. In retrospect her GI problems were alcohol related. On November 1st she was reported and admitted she had a problem with alcohol and sought help as outlined in outside records. She has not had a drink since Nov 1. Her employer suggested she try OP treatment with [facility]. On November 16 she met with CDIOP staff member Brandon Melnick :  Orientation to CD-IOP: The patient is a 35 yo single, white, female seeing entry into the CD-IOP. The Chief of Personnel at the [employer] Department referred her to our program. The patient reported she had gone to work on November 1 and someone smelled alcohol on her. A BAC was collected and it registered .08. The patient was temporarily out of work and her PCP referred her to the hospital in [place] for an alcohol detox. They sent her home with a Librium taper and she completed it on Tuesday, November 7.  The patient reported she has had no alcohol since November 1. She will return to work tomorrow, but in a different position for the time being. Her new assignment will accommodate any hours she needs to miss in order to attend the group. The patient was very open about her alcohol use and stated, "I am an alcoholic". She expressed relief that this problem is finally going to be  addressed. The patient reported she first drank at age 52 and began drinking more frequently when she entered college at [employer] and played on the softball team. Drinking was a significant part of her college experience and the patient admitted she has been drinking daily for 13 years. 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 The patient is an only child and was born and raised in [place], [place]. Despite both of her parents being alcoholics, the patient described a happy and loving childhood. She and her father were very close and he was very supportive of her athletic accomplishments. He died in 2015-01-22 "from drinking and other things". The patient never mourned his death and admitted she has used alcohol to numb the pain and losses in her life. Her three maternal aunts are also chemically dependent. The oldest died of an opiate overdose (using fentanyl patches) while another has been sober for 20+ years. The patient knows little of her  family history.  She explained that her maternal grandmother killed herself and all four daughters grew up in an orphanage. Her mother refuses to talk about her childhood. She currently lives in [place] with her mother. The patient reported she had been in a toxic relationship with a woman for 5 years. The S/O was manipulative, accused the patient of robbing the home, lied to the police, and was able to secure a restraining order, which is good for the next year. 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 The patient was very revealing and cooperative throughout the session, but admitted she did not typically share her feelings and was disclosing more today in this session than she ever had. She admitted being a "people pleaser" and a stuffer. 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 She understood that this way of dealing with feelings and pain would be address in this program and seemed to welcome it. The documentation was reviewed, signed and the orientation and CCA completed accordingly. The patient will return tomorrow and begin the CD-IOP.      Electronically signed by Brandon Melnick, LCAS at 05/15/2016 10:33 AM    In speaking with her today her Father's terminal illness was quite traumatic with persistent anxiety for which she has taken Zoloft the past 5 years .Although her childhood was not abusive she understands now its impact on her growth and development in learning how to deal with her emotions and in having healthy relationships.Her self assessment quiz online from [place] informed her of the codependency issues she faces.  She readily admits to her alcoholism as a daily drinker for the past 13 years and per her Subjective statement her desire to treat it. She does admit to cravings and she is wanting MAT.She is reluctant to retry Campral as she took it before with the understanding that it would "keep me from wanting to drink" which it didnt. Her Depression and anxiety screens are very low scoring.  Associated Signs/Symptoms: Audit 28 with Dependency score of 8 +Almost certainly Alcoholic/CAGE 4/4 + DSM 5 SUD Criteria 11/11 + for alcohol= Severe SUD Dependence  Depression Symptoms:   feelings of worthlessness/guilt, loss of energy/fatigue, weight gain, increased appetite, PHQ 9 score 0 Screen  Not difficult  (Hypo) Manic Symptoms:  None   Anxiety Symptoms:GAD 7 score 4/ Not difficult  Excessive Worry,Takes Zoloft Panic Symptoms,No Obsessive Compulsive Symptoms:   None,, Social Anxiety,No Specific Phobias,Snakes  Psychotic Symptoms:  NONE   PTSD Symptoms: Had a traumatic exposure:  Father's terminal illness;Work related incidents resolved; ACOA  issues Had a traumatic exposure in the last month:  No Re-experiencing:  None Hypervigilance:  At work +/At home no Hyperarousal:  Emotional Numbness/Detachment Avoidance:  Decreased Interest/Participation  Past Psychiatric History: Diagnosis:Alcohol Use Disorder see above;Anxiety 2012  Hospitalizations: None  Outpatient Care:Dr Johna Roles MD [place] 2012;[facility] [place] [facility] now  Substance Abuse Care:[facility] ED;[facility] 2017  Self-Mutilation: NA  Suicidal Attempts: NONE  Violent Behaviors: NONE   Previous Psychotropic Medications: Yes  chlordiazePOXIDE (LIBRIUM) 25 MG capsule   Take 1 tablet three times a day, then 1 tablet twice a day, then 1 tablet once a day, then stop 6 capsule  0 05/02/2016    Substance Abuse History in the last 12 months:  Yes.   Substance Abuse History in  the last 12 months: Substance Age of 1st Use Last Use Amount Specific Type  Nicotine 20 today  Cigs and smokeless  Alcohol 20 Nov 1,2017 1/2 5th 10-15 beers Rum 12-16 oz cans  Cannabis      Opiates      Cocaine      Methamphetamines      LSD      Ecstasy      Benzodiazepines      Caffeine      Inhalants      Others:                          Consequences of Substance Abuse: Medical Consequences:  Elevated liver enzymes with negative Acute Hepatitis Panel/Alcoholic Gastritis/ Chronic diarrhea Legal Consequences:  Job affected Family Consequences:  None other than ACOA issues Blackouts:  Yes DT's: NA Withdrawal Symptoms:   Lack of energy  Past Medical History:  Past Medical History:  Diagnosis Date  . UTI (urinary tract infection)    H/O one   Surgery-Wisdom teeth age 64  Developmental History:Negative Prenatal History:  Birth History: Postnatal Infancy:  Developmental History:  Milestones:Normal  Sit-Up:   Crawl:   Walk:  Speech: Family Psychiatric History:Maternal Alcoholism;Drug addiction;Depression;Anxiety;Suicide (MGM)                                                     Paternal-Father alcoholic-no other known family history  Family History:  Family History  Problem Relation Age of Onset  . Alcohol abuse Father   . Alcohol abuse Maternal Aunt   . Cancer Paternal Aunt     breast  . Cancer Maternal Grandmother     breast  . Cancer Paternal Grandfather     lung    Social History:   Social History   Social History  . Marital status: Single    Spouse name: N/A  . Number of children: N/A  . Years of education: N/A   Social History Main Topics  . Smoking status: Current Every Day Smoker    Types: Cigarettes  . Smokeless tobacco: Current User    Types: Snuff  . Alcohol use 12.0 oz/week    10 Cans of beer, 10 Shots of liquor per week  . Drug use: No  . Sexual activity: Not Currently   Other Topics Concern  . 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 As a result, the patient cannot go to her home nor work as a Primary school teacher. 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  Allergies:  No Known Allergies  Metabolic Disorder Labs: Lab Results  Component Value Date   HGBA1C 5.5 09/28/2015   No results found for: PROLACTIN Lab Results  Component Value Date  CHOL 180 05/02/2016   TRIG 470 (H) 05/02/2016   HDL 51 05/02/2016   CHOLHDL 3.5 05/02/2016   VLDL UNABLE TO CALCULATE IF TRIGLYCERIDE OVER 400 mg/dL 05/02/2016   LDLCALC UNABLE TO CALCULATE IF TRIGLYCERIDE OVER 400 mg/dL 05/02/2016   LDLCALC 110 (H) 10/02/2015  Results for AMRITA, RADU (MRN [id number]) as of 05/26/2016 14:58   Ref. Range 09/28/2015 09:59  Sodium Latest Ref Range: 134 - 144 mmol/L 137  Potassium Latest Ref Range: 3.5 - 5.2 mmol/L 4.1  Chloride Latest Ref Range: 96 - 106 mmol/L 96  BUN Latest Ref Range: 6 - 20 mg/dL 10  Creatinine Latest Ref Range: 0.57 - 1.00 mg/dL 0.69  Calcium Latest Ref Range: 8.7 - 10.2 mg/dL 9.5  EGFR (Non-African Amer.) Latest Ref Range: >59 mL/min/1.73 116  EGFR (African American) Latest Ref Range: >59 mL/min/1.73 133  Glucose Latest Ref Range: 65 - 99 mg/dL 92  BUN/Creatinine Ratio Latest Ref Range: 8 - 20  14  Phosphorus Latest Ref Range: 2.5 - 4.5 mg/dL 3.1  Alkaline Phosphatase Latest Ref Range: 39 - 117 IU/L 88  Albumin Latest Ref Range: 3.5 - 5.5 g/dL 4.5  Albumin/Globulin Ratio Latest Ref Range: 1.2 - 2.2  1.7  Uric Acid Latest Ref Range: 2.5 - 7.1 mg/dL 6.4  AST Latest Ref Range: 0 - 40 IU/L 93 (H)  ALT Latest Ref Range: 0 - 32 IU/L 86 (H)  Total Bilirubin Latest Ref Range: 0.0 - 1.2 mg/dL <0.2  GGT Latest Ref Range: 0 - 60 IU/L 231 (H)  Estimated CHD Risk Latest Ref Range: 0.0 - 1.0  times avg. 1.6 (H)  LDH Latest Ref Range: 119 - 226 IU/L 241 (H)  Cholesterol, Total Latest Ref Range: 100 - 199 mg/dL 272 (H)  Triglycerides Latest Ref Range: 0 - 149 mg/dL 827 (HH)  HDL Cholesterol Latest Ref Range: >39 mg/dL 47  LDL (calc) Latest Ref Range: 0 - 99 mg/dL Comment  Total CHOL/HDL Ratio Latest Ref Range: 0.0 - 4.4 ratio units 5.8 (H)  VLDL Cholesterol Cal Latest Ref Range: 5 - 40 mg/dL Comment  Iron Latest Ref Range: 27 - 159 ug/dL 99  Vitamin D, 25-Hydroxy Latest Ref Range: 30.0 - 100.0 ng/mL 14.4 (L)  Globulin, Total Latest Ref Range: 1.5 - 4.5 g/dL 2.6  WBC Latest Ref Range: 3.4 - 10.8 x10E3/uL 7.3  RBC Latest Ref Range: 3.77 - 5.28 x10E6/uL 4.24  Hemoglobin Latest Ref Range: 11.1 - 15.9 g/dL 13.0  HCT Latest Ref Range: 34.0 - 46.6 % 40.2  MCV Latest Ref Range: 79 - 97 fL 95  MCH Latest Ref Range: 26.6 - 33.0 pg 30.7  MCHC Latest Ref Range: 31.5 - 35.7 g/dL  32.3  RDW Latest Ref Range: 12.3 - 15.4 % 14.1  Platelets Latest Ref Range: 150 - 379 x10E3/uL 287  NEUT# Latest Ref Range: 1.4 - 7.0 x10E3/uL 4.7  Neutrophils Latest Units: % 64  Lymphocyte # Latest Ref Range: 0.7 - 3.1 x10E3/uL 2.0  Monocytes Absolute Latest Ref Range: 0.1 - 0.9 x10E3/uL 0.4  Basophils Absolute Latest Ref Range: 0.0 - 0.2 x10E3/uL 0.0  Immature Granulocytes Latest Units: % 0  Immature Grans (Abs) Latest Ref Range: 0.0 - 0.1 x10E3/uL 0.0  Lymphs Latest Units: % 28  Monocytes Latest Units: % 6  Basos Latest Units: % 1  Eos Latest Units: % 1  EOS (ABSOLUTE) Latest Ref Range: 0.0 - 0.4 x10E3/uL 0.1  Hemoglobin A1C Latest Ref Range: 4.8 - 5.6 % 5.5  TSH Latest Ref Range: 0.450 - 4.500 uIU/mL 1.530  Thyroxine (T4) Latest Ref Range: 4.5 - 12.0 ug/dL 5.6  Free Thyroxine Index Latest Ref Range: 1.2 - 4.9  1.5  T3 Uptake Ratio Latest Ref Range: 24 - 39 % 26  Est. average glucose Bld gHb Est-mCnc Latest Units: mg/dL 111  Total Protein Latest Ref Range: 6.0 - 8.5 g/dL 7.1   XRAY CLINICAL DATA:  Chronic diarrhea elevated liver enzymes abdominal pain for 1 month EXAM: ABDOMEN ULTRASOUND COMPLETE COMPARISON:  None.  FINDINGS: Gallbladder: No gallstones or wall thickening visualized. No sonographic Murphy sign noted by sonographer. Common bile duct: Diameter: 3 mm Liver: Diffusely coarsened echotexture. No focal abnormalities identified but sensitivity for potential focal abnormalities is diminished. IVC: No abnormality visualized. Pancreas: Obscured by bowel gas Spleen: Size and appearance within normal limits. Right Kidney: Length: 10.4 cm. Echogenicity within normal limits. No mass or hydronephrosis visualized. Left Kidney: Length: 10.8 cm. Echogenicity within normal limits. No mass or hydronephrosis visualized.  Abdominal aorta: Normal proximally but obscured by bowel gas distally. Other findings: None.   IMPRESSION: Study somewhat limited by bowel gas. Evidence  of hepatic parenchymal disease likely steatosis. No acute abnormalities.   Current Medications: Current Outpatient Prescriptions  Medication Sig Dispense Refill  . omeprazole (PRILOSEC) 20 MG capsule Take 1 capsule (20 mg total) by mouth daily. 30 capsule 6  . pravastatin (PRAVACHOL) 20 MG tablet TAKE 1 TABLET(20 MG) BY MOUTH DAILY 30 tablet 2  . sertraline (ZOLOFT) 100 MG tablet Take 100 mg by mouth daily.  0  . topiramate (TOPAMAX) 50 MG tablet Take 1 tablet (50 mg total) by mouth at bedtime. 30 tablet 2   No current facility-administered medications for this visit.     Neurologic: Headache: Negative Seizure: Negative Paresthesias:Negative  Musculoskeletal: Strength & Muscle Tone: within normal limits Gait & Station: normal Patient leans: N/A  Psychiatric Specialty Exam: Review of Systems  Constitutional: Positive for malaise/fatigue. Negative for chills, diaphoresis, fever and weight loss.  HENT: Negative for congestion, ear discharge, ear pain, hearing loss, nosebleeds, sinus pain, sore throat and tinnitus.   Eyes: Negative for blurred vision, double vision, photophobia, pain, discharge and redness.  Respiratory: Negative for cough, hemoptysis, sputum production, shortness of breath, wheezing and stridor.   Cardiovascular: Negative for chest pain, palpitations, orthopnea, claudication and leg swelling.  Gastrointestinal: Positive for abdominal pain, diarrhea and heartburn. Negative for blood in stool, constipation, melena, nausea and vomiting.       Symptoms all imprioved with cessation of alcohol consumption  Genitourinary: Negative for dysuria, flank pain, frequency, hematuria and urgency.  Musculoskeletal: Negative for back pain, falls, joint pain, myalgias and neck pain.  Skin: Negative for itching and rash.  Neurological: Negative for dizziness, tingling, tremors, sensory change, speech change, focal weakness, seizures, loss of consciousness, weakness and headaches.    Endo/Heme/Allergies: Positive for polydipsia. Negative for environmental allergies. Does not bruise/bleed easily.  Psychiatric/Behavioral: Positive for substance abuse. Negative for hallucinations, memory loss and suicidal ideas. The patient is nervous/anxious. The patient does not have insomnia.     Blood pressure 120/80, pulse 85, height 5' 7" (1.702 m), weight 207 lb (93.9 kg), SpO2 97 %.Body mass index is 32.42 kg/m.  General Appearance: Well Groomed Obese  Eye Contact:  Good  Speech:  Clear and Coherent  Volume:  Normal  Mood:  Variable  Affect:  Congruent and Full Range  Thought Process:  Goal Directed  Orientation:  Full (Time, Place, and Person)  Thought  Content:  WDL, Logical and trauma informed  Suicidal Thoughts:  No  Homicidal Thoughts:  No  Memory:  Intact with hx of Blackouts  Judgement:  Other:  Intact except for use of alcohol and intimate relationships  Insight:  Lacking  Psychomotor Activity:  Normal  Concentration:  Concentration: Good and Attention Span: Good  Recall:  Good except for blackouts  Fund of Knowledge:Good  Language: Good  Akathisia:  NA  Handed:  Right  AIMS (if indicated):  NA  Assets:  Communication Skills Desire for Improvement Financial Resources/Insurance Housing Resilience Talents/Skills Transportation Vocational/Educational  ADL's:  Intact  Cognition: Impaired,  Moderate  Sleep:  No complaints    Treatment Plan Summary: Treatment Plan/Recommendations:  Plan of Care: Alcoholism,PTSD,Relationship problems (ACOA/Codependency issue)-[facility] CDIOP-see Counselor's individualized treatment program. Cravings MAT  Mood/anxiety-continue Zoloft Smoking-education/fu with PCP when motivated LFTs Triglycerides GI -CDIOP abstinence program/FU with PCP  Laboratory:  UDS per protocol/insurance  Psychotherapy: IOP Group Individual and Family  Medications: RX Topiramate for Cravings-Continue previous rx  Routine PRN Medications:  Negative   Consultations: NA  Safety Concerns:  Relapse  Other:  None     Darlyne Russian, PA-C 11/27/20174:38 [facility]

## 2016-05-27 NOTE — Progress Notes (Signed)
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 Patients were active and engaged and discussed the recent holiday break, challenges to their recovery process, and family interactions. UDS were collected from all patients present. 2 group members shared about lapsing over the break. One patient was absent with no known attempt to communicate. Another group member was absent and called to say she was sick at home. Some patients met with program director to discuss MAT.     Group Time: 2:30-4  Participation Level: Active  Behavioral Response: Appropriate and Sharing  Type of Therapy: Psycho-education Group  Interventions: CBT, Motivational Interviewing and Solution Focused  Topic: Counselors met with patients for 1.5 hour group psychoeducation session. Counselor led topical discussion on "Adult Children of Alcoholics". Patients identified characteristics of healthy and unhealthy families. Counselor also led a 5 minute guided imagery meditation about "Coping with Change".  Patient processed their response to activity and meditation.   Summary: Patient was active and engaged in group session. She presented as somewhat negative and blaming of herself when reporting that she had returned to using beer over the break. Patient discussed the triggering circumstance surrounding her return to use which included nostalgia for holidays and decorating house. Patient stated she "found beer in a basement fridge" and has since removed all alcohol in her house. Patient reports she was not aware of the alcohol prior to the incident. Patient seemed interested and engaged in discussion on ACOA and was able to articulate many characteristics of healthy and unhealthy families. Wes Swan, LPCA   UDS collected: Yes Results: Pending  AA/NA attended?: YesThursday, Friday and Saturday  Sponsor?: No    , LCAS 05/27/2016 3:08 PM 

## 2016-05-28 ENCOUNTER — Other Ambulatory Visit (HOSPITAL_COMMUNITY): Payer: Managed Care, Other (non HMO) | Admitting: Psychology

## 2016-05-28 DIAGNOSIS — F1020 Alcohol dependence, uncomplicated: Secondary | ICD-10-CM

## 2016-05-28 DIAGNOSIS — F411 Generalized anxiety disorder: Secondary | ICD-10-CM

## 2016-05-28 DIAGNOSIS — Z0001 Encounter for general adult medical examination with abnormal findings: Secondary | ICD-10-CM | POA: Diagnosis not present

## 2016-05-28 DIAGNOSIS — F4310 Post-traumatic stress disorder, unspecified: Secondary | ICD-10-CM

## 2016-05-29 ENCOUNTER — Other Ambulatory Visit (HOSPITAL_COMMUNITY): Payer: Managed Care, Other (non HMO) | Admitting: Psychology

## 2016-05-29 DIAGNOSIS — F1020 Alcohol dependence, uncomplicated: Secondary | ICD-10-CM

## 2016-05-29 DIAGNOSIS — F4310 Post-traumatic stress disorder, unspecified: Secondary | ICD-10-CM

## 2016-05-29 DIAGNOSIS — Z0001 Encounter for general adult medical examination with abnormal findings: Secondary | ICD-10-CM | POA: Diagnosis not present

## 2016-05-30 NOTE — Progress Notes (Signed)
    Daily Group Progress Note  Program: CD-IOP   05/30/2016 Stacey Moreno [id number]  Diagnosis:  Alcohol use disorder, severe, dependence ([place])  PTSD (post-traumatic stress disorder)  GAD (generalized anxiety disorder)   Sobriety Date: 05/23/16  Group Time: 1-2:30  Participation Level: Active  Behavioral Response: Appropriate and Sharing  Type of Therapy: Process Group  Interventions: CBT  Topic: Counselors met with patients to discuss the activities they had engaged in to support their recovery since the previous session. A new group member introduced himself to the group and shared his reasons for pursuing treatment. Most members were active and engaged in the discussion concerning recovery from mind-altering drugs and alcohol.      Group Time: 2:30-4  Participation Level: Active  Behavioral Response: Appropriate and Sharing  Type of Therapy: Psycho-education Group  Interventions: Strength-based and Supportive  Topic: Counselors continued the discussion from the previous session about characteristics of Adult Children of Alcoholics. The group members finished reading though the laundry list and were asked to identify which characteristics they personally identified with. The counselors then helped the patients create personal measurable goals for their treatment based on the characteristic they identified. Two group members met with the Investment banker, operational. A drug test was collected from one group member. PHQ-9 and GAD assessments were collected from all group members.    Summary: The patient presented as active and engaged in group. Since the previous session she shared that she had become increasingly aware of her triggers, particularly within her family. She has begun reading more about alcoholism and readily admits that her family has a history of alcoholism. In her personal life she has been integrating the serenity prayer into her daily practice. She identified  that she is driven by seeking approval which is directly linked to her self-esteem. PHQ-9: 2 GAD: 0. The patient was unable to attend any meetings. WEs Swan, LPCA   UDS collected: No Results: positive for alcohol 11/27  AA/NA attended?: No  Sponsor?: No   Brandon Melnick, LCAS 05/30/2016 1:26 PM

## 2016-05-30 NOTE — Progress Notes (Signed)
    Daily Group Progress Note  Program: CD-IOP   05/30/2016 Stacey Moreno [id number]  Diagnosis:  Alcohol use disorder, severe, dependence ([place])  PTSD (post-traumatic stress disorder)   Sobriety Date: 05/23/16  Group Time: 1-2:30  Participation Level: Active  Behavioral Response: Appropriate and Sharing  Type of Therapy: Process Group  Interventions: CBT  Topic: Counselor met with patients for 1.5 hour group process therapy session. Patients were active, engaged, and talked lively. Patients spoke on their recovery and sobriety from mind-altering drugs and alcohol. One UDS was collected from patient.      Group Time: 2:30-4  Participation Level: Active  Behavioral Response: Appropriate and Sharing  Type of Therapy: Psycho-education Group  Interventions: Meditation: MBRP  Topic: Counselor met with patients for 1.5 hour group psychoeducation therapy session. Counselor led discussing on and experience of mindfulness based relapse prevention (MBRP) for using meditation during recovery from mind-altering drugs and alcohol. Patients were moderately interested and presented as tired and somewhat disengaged.   Summary: Patient presented for CD-IOP and was active and engaged during check-in and process but seemed somewhat disengaged during psychoeducation. Patient offered helpful feedback to group members about problem solving. Patient reports she is enjoying reading AA literature and "being able to comprehend what I read sober." Patient was smiling and chuckling during group and seemed to have good rapport with group members and counselor. Patient reported that she has only been to 1 AA meeting thus far and counselor encouraged her to consider the tx plan she signed which indicates 4 meetings /week. Patient agreed and said she "is working up to that". 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 The group expressed empathy and encouraged her to listen to [place] speaker meetings online. Patient reacted positively and stated she would try that. Patient was somewhat tired during meditation/psychoeducation and stated she was "busy thinking too much" but also reported "it was relaxing". Youlanda Roys, LPCA  UDS collected: No Results: positive for Alcohol 11/27  AA/NA attended?: No  Sponsor?: No   Brandon Melnick, LCAS 05/30/2016 12:26 PM

## 2016-06-02 ENCOUNTER — Other Ambulatory Visit (HOSPITAL_COMMUNITY): Payer: Managed Care, Other (non HMO) | Attending: Psychiatry | Admitting: Psychology

## 2016-06-02 DIAGNOSIS — F1020 Alcohol dependence, uncomplicated: Secondary | ICD-10-CM

## 2016-06-02 DIAGNOSIS — Z0001 Encounter for general adult medical examination with abnormal findings: Secondary | ICD-10-CM | POA: Insufficient documentation

## 2016-06-02 DIAGNOSIS — F1010 Alcohol abuse, uncomplicated: Secondary | ICD-10-CM | POA: Diagnosis not present

## 2016-06-02 DIAGNOSIS — F4329 Adjustment disorder with other symptoms: Secondary | ICD-10-CM

## 2016-06-02 DIAGNOSIS — F4381 Prolonged grief disorder: Secondary | ICD-10-CM

## 2016-06-03 NOTE — Progress Notes (Signed)
Stacey Moreno is a 35 y.o. female patient. CD-IOP: Treatment Planning Session. I met with the patient today for our first individual counseling session. During our first session, we discussed her goals for treatment here in the CD-IOP. I complimented the patient on her openness in group sessions and her willingness to accept her alcoholism. The patient admitted she has known she was an alcoholic for a long time and knew it was just a matter of time before it would have to be addressed. The patient identified her first goal of treatment as learning how to remain alcohol-free. She has already 'returned to use' after just one group session. 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 She also agreed that she cannot do it herself and is willing to attend 12-step meetings. She expressed reluctance to attend meetings near her home and work and was worried about a lack of anonymity. I reminded her that this is a chronic illness and she will eventually want to find meetings that are convenient to her since she, in theory, should plan on attending them for the rest of her life.  However, if she would rather drive to [facility] or [place], there are [employer] everywhere. When asked about any other goals she would like to work on while here, the patient identified that she has poor self-esteem and would like to feel good about herself. "I am a people pleaser", she stated and I want to be true to me and not continue to do things for others that compromise myself. We agreed that addressing her Core Beliefs and working on becoming more assertive are both ways we could address and improve or strengthen her self-esteem. The patient was very receptive to this suggestion and pleased that we would be working on this issue. The treatment plan was reviewed, signed and completed accordingly. We also agreed to meet every Monday after group for her  weekly session. The patient responded well to this intervention and we will continue to follow closely in the days ahead. Her sobriety date remains 11/20.        Brandon Melnick, LCAS

## 2016-06-04 ENCOUNTER — Other Ambulatory Visit (HOSPITAL_COMMUNITY): Payer: Managed Care, Other (non HMO) | Admitting: Psychology

## 2016-06-04 DIAGNOSIS — Z0001 Encounter for general adult medical examination with abnormal findings: Secondary | ICD-10-CM | POA: Diagnosis not present

## 2016-06-04 DIAGNOSIS — F1020 Alcohol dependence, uncomplicated: Secondary | ICD-10-CM

## 2016-06-05 ENCOUNTER — Encounter (HOSPITAL_COMMUNITY): Payer: Self-pay | Admitting: Psychology

## 2016-06-05 ENCOUNTER — Other Ambulatory Visit (HOSPITAL_COMMUNITY): Payer: Managed Care, Other (non HMO)

## 2016-06-05 NOTE — Progress Notes (Signed)
    Daily Group Progress Note  Program: CD-IOP   06/05/2016 Stacey Moreno [id number]  Diagnosis:  No diagnosis found.   Sobriety Date: 11/24  Group Time: 1-2:30 pm  Participation Level: Active  Behavioral Response: Appropriate and Sharing  Type of Therapy: Process Group  Interventions: Supportive  Topic: Process: The first half f group was spent in process. Members shared about the past weekend and any struggles or challenges they had faced in early recovery. They also identified the various ways they had supported and strengthened their recovery. During group today, three group members met with the program director, including the newest member. Four drug tests were collected today.   Group Time: 2:30-4 pm  Participation Level: Active  Behavioral Response: Appropriate  Type of Therapy: Psycho-education Group  Interventions: Solution Focused  Topic: Psycho-Ed: The Wheel of Life. The second half of group was spent in a psycho-ed. A handout was provided that included 'Assessing Your Life Balance". Members colored in the six different sections and identified how satisfied they were in each category. These categories included Physical, financial, intellectual, emotional, social and spiritual. Members were active and engaged in sharing their findings, including identifying where they felt strongest and in what area they might want to improve upon.  Summary: The patient reported an uneventful weekend. She continues to work on re-arranging her house in response to this counselor's encouragement about emotional memory. She had cleaned everything and was very pleased with the condition of her home. She was able to attend the meeting at the [facility] at 5:30, but noted that there were not many people there and not much enthusiasm. The patient denied attending any other meetings and this counselor reminded her that this program requires members to attend at least four 12-step meetings or  recovery-related meetings per week. She will need to increase her schedule. The patient's drug test was returned from last week and it was positive for alcohol on the Monday after the Thanksgiving holiday. Despite having reported drinking just 4 beers on the holiday, the test was still alcohol-positive after over 80 hours. She denied having drank over the weekend and was adamant about this. In the psycho-ed, she was engaged in coloring her boxes. When asked what she would like to improve upon, the patient admitted that her physical condition is an area that could be improved. Eating better and going to the gym are just two things she identified that would improve her physical health and conditioning. The patient was engaged in the session and another drug test was collected today. Without a doubt, this one should be negative for alcohol. We will continue to follow this patient closely in the days ahead.   UDS collected: Yes Results: pending  AA/NA attended?: YesThursday  Sponsor?: No   Brandon Melnick, [place] 06/05/2016 10:28 AM

## 2016-06-06 ENCOUNTER — Encounter (HOSPITAL_COMMUNITY): Payer: Self-pay | Admitting: Psychology

## 2016-06-06 NOTE — Progress Notes (Signed)
    Daily Group Progress Note  Program: CD-IOP   06/06/2016 Stacey Moreno [id number]  Diagnosis: F10.20 Alcohol use disorder, severe  Sobriety Date: 11/24  Group Time: 1-2:30 pm  Participation Level: Active  Behavioral Response: Appropriate and Sharing  Type of Therapy: Process Group  Interventions: Supportive  Topic: Process: Counselors met with patients to discuss the activities they had engaged in to support their recovery. A new group member introduced himself to the group and shared his reasons for pursuing treatment. All members were active and engaged in the discussion concerning recovery from mind-altering drugs and alcohol.   Group Time: 2:30-4 pm  Participation Level: Active  Behavioral Response: Sharing  Type of Therapy: Psycho-education Group  Interventions: Strength-based  Topic: Psychoeducation: Counselors used a DBT worksheet on interpersonal effectiveness to address patient's internalized myths that impede their recovery. Group members were asked to read faulty statements and create their own challenges to them. Counselors checked in regarding goals from the previous session. Several group members met with the Investment banker, operational. A drug test was collected from one group member.   Summary: The patient presented as active and engaged in group. She shared that after the previous session she had attended a new AA meeting - it was "okay" and the group was not very "talkative." The group encouraged her to continue trying new groups. She continues to have a growing awareness of her triggers, particularly around the holidays. Many of the activities she used to engage in makes her want to use. However, she has been rearranging her furniture in an effort to reduce external triggers. She shared positive news with the group and the group was able to celebrate with her. She has a chance to regain her old home which a former S/O had managed to extricate from her. She is going  to contact her attorney to try and avoid foreclosure. The patient attended one [place] meeting since we last met. She stated her intention to attend a number of meetings this weekend and was going to meet up with some other group members. The patient responded well to this intervention.   UDS collected: No Results:   AA/NA attended?: YesMonday  Sponsor?: No   Brandon Melnick, [place] 06/06/2016 1:02 PM

## 2016-06-06 NOTE — Progress Notes (Signed)
Stacey Moreno is a 35 y.o. female patient. CD-IOP: Excused Absence. Patient phoned and asked if she could be allowed to miss group today. She has a chance to meet with her attorney and then the bank regarding her home (that she no longer lives in, but still owns) and how she might avoid foreclosure. Based on my knowledge of this history and the fact that she is about to be foreclosed on and the damage that would do to her credit, I agreed she would be excused from group today. She hasn't missed any sessions and is a good and active group member. She thanked me and agreed she woulld be back in group on Monday, December 11. She stated that she had plans to meet some of the other ladies in our group and attend some AA meetings together this weekend. We will collect drug test on Monday, but I will excuse her from group today.        Brandon Melnick, LCAS

## 2016-06-09 ENCOUNTER — Telehealth (HOSPITAL_COMMUNITY): Payer: Self-pay | Admitting: Psychology

## 2016-06-09 ENCOUNTER — Other Ambulatory Visit (HOSPITAL_COMMUNITY): Payer: Managed Care, Other (non HMO)

## 2016-06-11 ENCOUNTER — Encounter (HOSPITAL_COMMUNITY): Payer: Self-pay | Admitting: Psychology

## 2016-06-11 ENCOUNTER — Other Ambulatory Visit (HOSPITAL_COMMUNITY): Payer: Managed Care, Other (non HMO) | Admitting: Psychology

## 2016-06-11 ENCOUNTER — Ambulatory Visit: Payer: Self-pay | Admitting: Physician Assistant

## 2016-06-11 ENCOUNTER — Encounter: Payer: Self-pay | Admitting: Physician Assistant

## 2016-06-11 VITALS — BP 120/80 | HR 65 | Temp 98.4°F

## 2016-06-11 DIAGNOSIS — J00 Acute nasopharyngitis [common cold]: Secondary | ICD-10-CM

## 2016-06-11 NOTE — Progress Notes (Signed)
S: C/o runny nose and congestion for 2-3 days, no fever, chills, cp/sob, v/d; mucus is clear and thick, cough is sporadic, c/o of facial and dental pain. Has fever blister on her lip but its already healing, is doing well with eoth abuse counseling, still sober, needs a note for work as they are monitoring her closely  Using otc meds:   O: PE: vitals wnl, nad, perrl eomi, normocephalic, tms dull, nasal mucosa red and swollen, throat injected, neck supple no lymph, lungs c t a, cv rrr, neuro intact  A:  Acute sinusitis   P: drink fluids, continue regular meds , use otc meds of choice, return if not improving in 5 days, return earlier if worsening, reassurance, viral; otc saline nasal wash, avoid any cold meds with etoh in it,

## 2016-06-12 ENCOUNTER — Other Ambulatory Visit (HOSPITAL_COMMUNITY): Payer: Managed Care, Other (non HMO) | Admitting: Psychology

## 2016-06-12 DIAGNOSIS — F4310 Post-traumatic stress disorder, unspecified: Secondary | ICD-10-CM

## 2016-06-12 DIAGNOSIS — F4381 Prolonged grief disorder: Secondary | ICD-10-CM

## 2016-06-12 DIAGNOSIS — F1020 Alcohol dependence, uncomplicated: Secondary | ICD-10-CM

## 2016-06-12 DIAGNOSIS — Z0001 Encounter for general adult medical examination with abnormal findings: Secondary | ICD-10-CM | POA: Diagnosis not present

## 2016-06-12 DIAGNOSIS — F4329 Adjustment disorder with other symptoms: Secondary | ICD-10-CM

## 2016-06-12 NOTE — Progress Notes (Signed)
Stacey Moreno is a 35 y.o. female patient. CD-IOP: Unexcused Absence. The patient did not phone nor did she appear for group today. She has missed the last two group sessions and those absences were excused. However, this absence is not excused and if I have not heard from her, I will contact her supervisor at work tomorrow morning.         Brandon Melnick, LCAS

## 2016-06-16 ENCOUNTER — Other Ambulatory Visit (HOSPITAL_COMMUNITY): Payer: Managed Care, Other (non HMO) | Admitting: Psychology

## 2016-06-16 DIAGNOSIS — F1020 Alcohol dependence, uncomplicated: Secondary | ICD-10-CM

## 2016-06-16 DIAGNOSIS — Z0001 Encounter for general adult medical examination with abnormal findings: Secondary | ICD-10-CM | POA: Diagnosis not present

## 2016-06-16 NOTE — Progress Notes (Signed)
    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 Patients were active and engaged and discussed their attempts at mindfulness, coping with triggers, and interpersonal conflicts over the break.      Group Time: 2:30-4  Participation Level: Active  Behavioral Response: Appropriate and Sharing  Type of Therapy: Psycho-education Group  Interventions: CBT  Topic: Counselors met with patients for group psychoeducation session with a discussion on challenging negative thinking. Counselor utilized a Set designer from [employer]" on Recovery Thoughts and helping patients challenge the myths they tell themselves in early recovery. Patients were responsive and engaged during group.    Summary: Patient was active and engaged in group today and presented as sick with a minor upper respiratory infection. She stated she went to the doctor which was verified in [place] by counselor. Patient tried to have a "relaxing weekend" last week and stated she had missed groups due to illness. Patient stated she is working on mindful and healthy eating habits, stating she began eating breakfast. Counselor encouraged patient since she did not drink or use over her break while being sick but stated she needed to attend more meetings since she had not been attending any 12 step meetings in the past 2 weeks. Youlanda Roys, LPCA   UDS collected: Yes Results: pending  AA/NA attended?: No  Sponsor?:  No   Brandon Melnick, LCAS 06/16/2016 5:15 PM

## 2016-06-18 ENCOUNTER — Encounter (HOSPITAL_COMMUNITY): Payer: Self-pay | Admitting: Psychology

## 2016-06-18 ENCOUNTER — Telehealth (HOSPITAL_COMMUNITY): Payer: Self-pay | Admitting: Psychology

## 2016-06-18 ENCOUNTER — Other Ambulatory Visit (HOSPITAL_COMMUNITY): Payer: Managed Care, Other (non HMO) | Admitting: Psychology

## 2016-06-18 DIAGNOSIS — F1020 Alcohol dependence, uncomplicated: Secondary | ICD-10-CM

## 2016-06-18 DIAGNOSIS — Z0001 Encounter for general adult medical examination with abnormal findings: Secondary | ICD-10-CM | POA: Diagnosis not present

## 2016-06-18 NOTE — Progress Notes (Signed)
Daily Group Progress Note  Program: CD-IOP   06/18/2016 Stacey Moreno [id number]  Diagnosis:  No diagnosis found.   Sobriety Date: 11/24  Group Time: 1-2:30 pm  Participation Level: Active  Behavioral Response: Sharing  Type of Therapy: Process Group  Interventions: Supportive  Topic: Process: the first half of group was spent in process. Members shared about the past weekend and any challenges or temptations they may have faced. They are also asked to identify what they did to enhance or strengthen their sobriety, including attending 12-step meetings, speaking with others in recovery, working with a sponsor or addressing step work. A new group member was present, and she met with the program director today. He also completed med checks with other group members. Random drug tests were collected today.   Group Time: 2:30-4 pm  Participation Level: Active  Behavioral Response: Sharing  Type of Therapy: Psycho-education Group  Interventions: Strength-based  Topic: Guided Meditation/Psycho-Ed: PAWS; the second half of group began with a 5 minute guided meditation led by one of the counselors. At the conclusion, group members seemed to agree that it had proven very relaxing. A handout was provided to members on the topic of PAWS or post-acute withdrawal syndrome. Members took turns reading the handout and then shared their own experiences with some of these very common symptoms of early recovery.  There was good conversation and feedback among group members.   Summary: The patient presented as engaged, but she still displayed symptoms of the cold that had kept her out of group for two sessions. She reported that she had attended an [place] meeting at [facility] in [place]. The speaker meeting had been better than she anticipated, and she reported that the speaker was a man she had arrested on numerous occasions through the years. She hadn't seen him in a long time and he had been  clean for three years. They talked after the meeting and she was very pleased for him. The patient also admitted she was surprised he had been able to stay clean that long. The patient reported she had intended on going to the meeting at [facility] and meeting another group member, but on Sunday morning her mother called her, and she went to her house. The mother's 88 yo dog, which the patient had given her, "to replace me when I left home" was dying. They took her to the vet and had her put down. The patient explained that on top of her father's death, this was just another devastating loss for her mother. She spent the day with her. "She was drinking, but I wasn't", the patient reported. The patient reported she is 'researching new meetings' closer to her home rather than driving all the way to [place]. The patient assured the group she would attend 2 AA meetings before we meet again. The patient also reported she had talked to her doctor and had told her about the Nyquil she had been taking. The PCP reminded her that this medication has alcohol in it. The patient reported she will probably test positive, but it had not occurred to her that the Nyquil might be a problem. Her drug test collected last Thursday had not yet been returned so the results were not available. The patient insisted she had not drunk anything. in the psycho-ed, the patient shared little of herself. she is not compliant with the program and it remains to be seen whether she will move into compliance with her 12-step weekly  attendance requirement of four meetings per week. The patient is here because her work is requiring her. Failing to complete successfully will likely result in termination. We will continue to follow closely in the days ahead.   UDS collected: Yes Results: pending  AA/NA attended?: YesSaturday  Sponsor?: No   Brandon Melnick, LCAS 06/18/2016 9:00 AM

## 2016-06-18 NOTE — Progress Notes (Signed)
Stacey Moreno is a 35 y.o. female patient. CD-IOP: Counselor phoned the personnel department to speak with the person monitoring this patient's treatment requirements per her employer. [place] is the gentleman I have emailed and spoken with in the past. He wanted to insure that [name] is following the rules and requirements of this program. He explained that they will support her in her recovery efforts, but if she doesn't complete successfully they will have to take the appropriate action. I explained her recent positive drug test and our willingness to accept the alcohol positive reading was due to the Nyquil, as she reported. I agreed to keep him informed of her progress and send drug test results. We will continue to follow this patient closely in the days ahead.         Brandon Melnick, LCAS

## 2016-06-19 ENCOUNTER — Other Ambulatory Visit (HOSPITAL_COMMUNITY): Payer: Managed Care, Other (non HMO)

## 2016-06-20 NOTE — Progress Notes (Signed)
    Daily Group Progress Note  Program: CD-IOP   06/20/2016 Stacey Moreno [id number]  Diagnosis:  Alcohol use disorder, severe, dependence ([place])   Sobriety Date: 06/16/16  Group Time: 1-2:30  Participation Level: Active  Behavioral Response: Appropriate, Sharing, Blaming and Agitated  Type of Therapy: Process Group  Interventions: CBT and Solution Focused  Topic: met with patients for 1.5 hour group process session and discussed recovery from mind-altering drugs, cravings, strategies, and resources for sobriety. Pts were active and engaged in discussion. 1 new group member was present and shared briefly and offered feedback to others. One group member became markedly tearful when discussing her recent alcoholism. UDS were collected from some members. Some members met with Darlyne Russian, program director.     Group Time: 2:30-4  Participation Level: Active  Behavioral Response: Appropriate, Sharing, Blaming and Agitated  Type of Therapy: Psycho-education Group  Interventions: CBT and Solution Focused  Topic: Met with patients for 1.5 hour group psychoeducation session with an emphasis on AA, 12 step knowledge, and how to pursue early recovery. Patients were all active and engaged in session. Counselor offered strategies for relapse prevention, calling friends, and reaching out for help at meetings.   Summary: Patient was active and engaged in group. She presented as frustrated that she received a negative 6 mo review at her employer. Patient shared extensively about her feelings towards coworkers and how she was losing her motivation to keep her position. She reported attending 1 AA meeting and stated she was finding more comfort with attending meetings. She stated that she was frustrated because she discovered while she was sick that her over-the-counter medication contained alcohol which and admitted she was willing to change her sobriety date, though she states she was  unaware of the alcohol content of the medication when she took it. Patient showed signs of resentment and blaming which may indicate her ambivalence about hope for getting better in tx. Stacey Moreno, LPCA   UDS collected: Yes Results: pending AA/NA attended?: YesWednesday  Sponsor?: No   Brandon Melnick, [place] 06/20/2016 12:27 PM

## 2016-06-25 ENCOUNTER — Other Ambulatory Visit (HOSPITAL_COMMUNITY): Payer: Managed Care, Other (non HMO)

## 2016-06-25 ENCOUNTER — Encounter (HOSPITAL_COMMUNITY): Payer: Self-pay | Admitting: Psychology

## 2016-06-25 NOTE — Progress Notes (Unsigned)
Stacey Moreno is a 35 y.o. female patient. CD-IOP: The patient sent email stating she had thought long and hard over the weekend and has decided it is time for her to pursue a different career. She recognizes that her current employment serves as a trigger for her drinking. She thanked me for the services, but stated she did not intend to continue in this program. I will attempt to contact her and remind her that her alcoholism doesn't go away just because she quits her job. In fact, it may be fed by her taking time off and not having anything to do. I will encourage her to remain in the program and finish out her treatment here.         Brandon Melnick, LCAS

## 2016-06-26 ENCOUNTER — Other Ambulatory Visit (HOSPITAL_COMMUNITY): Payer: Managed Care, Other (non HMO)

## 2016-07-02 ENCOUNTER — Encounter: Payer: Self-pay | Admitting: Anesthesiology

## 2016-07-02 ENCOUNTER — Encounter: Payer: Self-pay | Admitting: *Deleted

## 2016-07-02 ENCOUNTER — Encounter
Admission: EM | Disposition: A | Discharge status: Home / Self Care | Payer: Self-pay | Source: Home / Self Care | Attending: Emergency Medicine

## 2016-07-02 ENCOUNTER — Ambulatory Visit (INDEPENDENT_AMBULATORY_CARE_PROVIDER_SITE_OTHER)
Admission: RE | Admit: 2016-07-02 | Discharge: 2016-07-02 | Disposition: A | Discharge status: Home / Self Care | Payer: Managed Care, Other (non HMO) | Source: Ambulatory Visit | Attending: Family Medicine | Admitting: Family Medicine

## 2016-07-02 ENCOUNTER — Other Ambulatory Visit (HOSPITAL_COMMUNITY): Payer: Managed Care, Other (non HMO)

## 2016-07-02 ENCOUNTER — Observation Stay
Admission: EM | Admit: 2016-07-02 | Discharge: 2016-07-03 | Disposition: A | Discharge status: Home / Self Care | Payer: Managed Care, Other (non HMO) | Attending: Surgery | Admitting: Surgery

## 2016-07-02 ENCOUNTER — Observation Stay: Payer: Managed Care, Other (non HMO) | Admitting: Anesthesiology

## 2016-07-02 ENCOUNTER — Ambulatory Visit (INDEPENDENT_AMBULATORY_CARE_PROVIDER_SITE_OTHER)
Admission: EM | Admit: 2016-07-02 | Discharge: 2016-07-02 | Disposition: A | Discharge status: Home / Self Care | Payer: Managed Care, Other (non HMO) | Source: Home / Self Care | Attending: Family Medicine | Admitting: Family Medicine

## 2016-07-02 ENCOUNTER — Encounter: Payer: Self-pay | Admitting: Emergency Medicine

## 2016-07-02 DIAGNOSIS — F17210 Nicotine dependence, cigarettes, uncomplicated: Secondary | ICD-10-CM | POA: Diagnosis not present

## 2016-07-02 DIAGNOSIS — K3580 Unspecified acute appendicitis: Secondary | ICD-10-CM

## 2016-07-02 DIAGNOSIS — K353 Acute appendicitis with localized peritonitis, without perforation or gangrene: Secondary | ICD-10-CM | POA: Insufficient documentation

## 2016-07-02 DIAGNOSIS — D252 Subserosal leiomyoma of uterus: Secondary | ICD-10-CM | POA: Diagnosis not present

## 2016-07-02 HISTORY — PX: LAPAROSCOPIC APPENDECTOMY: SHX408

## 2016-07-02 LAB — COMPREHENSIVE METABOLIC PANEL
ALK PHOS: 89 U/L (ref 38–126)
ALT: 23 U/L (ref 14–54)
AST: 30 U/L (ref 15–41)
Albumin: 4.3 g/dL (ref 3.5–5.0)
Anion gap: 9 (ref 5–15)
BUN: 7 mg/dL (ref 6–20)
CALCIUM: 9.2 mg/dL (ref 8.9–10.3)
CHLORIDE: 101 mmol/L (ref 101–111)
CO2: 23 mmol/L (ref 22–32)
CREATININE: 0.64 mg/dL (ref 0.44–1.00)
GFR calc Af Amer: >60 mL/min (ref >60)
GFR calc non Af Amer: >60 mL/min (ref >60)
Glucose, Bld: 100 mg/dL — ABNORMAL HIGH (ref 65–99)
Potassium: 3.7 mmol/L (ref 3.5–5.1)
SODIUM: 133 mmol/L — AB (ref 135–145)
Total Bilirubin: 1.1 mg/dL (ref 0.3–1.2)
Total Protein: 7.7 g/dL (ref 6.5–8.1)

## 2016-07-02 LAB — URINALYSIS, COMPLETE (UACMP) WITH MICROSCOPIC
Bilirubin Urine: NEGATIVE
GLUCOSE, UA: NEGATIVE mg/dL
Ketones, ur: NEGATIVE mg/dL
Leukocytes, UA: NEGATIVE
NITRITE: NEGATIVE
PROTEIN: NEGATIVE mg/dL
SPECIFIC GRAVITY, URINE: 1.015 (ref 1.005–1.030)
pH: 6 (ref 5.0–8.0)

## 2016-07-02 LAB — CBC WITH DIFFERENTIAL/PLATELET
BASOS PCT: 0 %
Basophils Absolute: 0 10*3/uL (ref 0–0.1)
EOS ABS: 0.1 10*3/uL (ref 0–0.7)
EOS PCT: 1 %
HCT: 38.7 % (ref 35.0–47.0)
Hemoglobin: 12.9 g/dL (ref 12.0–16.0)
LYMPHS ABS: 2.3 10*3/uL (ref 1.0–3.6)
Lymphocytes Relative: 18 %
MCH: 31.3 pg (ref 26.0–34.0)
MCHC: 33.3 g/dL (ref 32.0–36.0)
MCV: 93.7 fL (ref 80.0–100.0)
MONO ABS: 0.6 10*3/uL (ref 0.2–0.9)
MONOS PCT: 5 %
Neutro Abs: 10 10*3/uL — ABNORMAL HIGH (ref 1.4–6.5)
Neutrophils Relative %: 76 %
PLATELETS: 249 10*3/uL (ref 150–440)
RBC: 4.13 MIL/uL (ref 3.80–5.20)
RDW: 15.3 % — AB (ref 11.5–14.5)
WBC: 13 10*3/uL — ABNORMAL HIGH (ref 3.6–11.0)

## 2016-07-02 LAB — PROTIME-INR
INR: 1.04
Prothrombin Time: 13.6 seconds (ref 11.4–15.2)

## 2016-07-02 LAB — TYPE AND SCREEN
ABO/RH(D): A POS
Antibody Screen: NEGATIVE

## 2016-07-02 LAB — POCT PREGNANCY, URINE: PREG TEST UR: NEGATIVE

## 2016-07-02 LAB — APTT: aPTT: 31 seconds (ref 24–36)

## 2016-07-02 LAB — PREGNANCY, URINE: PREG TEST UR: NEGATIVE

## 2016-07-02 SURGERY — APPENDECTOMY, LAPAROSCOPIC
Anesthesia: Choice

## 2016-07-02 SURGERY — APPENDECTOMY, LAPAROSCOPIC
Anesthesia: General | Site: Abdomen | Wound class: Clean Contaminated

## 2016-07-02 MED ORDER — FENTANYL CITRATE (PF) 100 MCG/2ML IJ SOLN
INTRAMUSCULAR | Status: AC
  Administered 2016-07-02: 50 ug via INTRAVENOUS
  Filled 2016-07-02: qty 2

## 2016-07-02 MED ORDER — LIDOCAINE HCL 1 % IJ SOLN
INTRAMUSCULAR | Status: DC | PRN
  Administered 2016-07-02: 14.5 mL

## 2016-07-02 MED ORDER — ACETAMINOPHEN 500 MG PO TABS
1000.0000 mg | ORAL_TABLET | Freq: Four times a day (QID) | ORAL | Status: DC
  Administered 2016-07-02 – 2016-07-03 (×3): 1000 mg via ORAL
  Filled 2016-07-02 (×3): qty 2

## 2016-07-02 MED ORDER — POLYETHYLENE GLYCOL 3350 17 G PO PACK: 17.0000 g | PACK | Freq: Every day | ORAL | Status: DC | PRN

## 2016-07-02 MED ORDER — PANTOPRAZOLE SODIUM 40 MG IV SOLR
40.0000 mg | Freq: Every day | INTRAVENOUS | Status: DC
  Administered 2016-07-02: 40 mg via INTRAVENOUS
  Filled 2016-07-02: qty 40

## 2016-07-02 MED ORDER — FENTANYL CITRATE (PF) 100 MCG/2ML IJ SOLN
50.0000 ug | Freq: Once | INTRAMUSCULAR | Status: AC
  Administered 2016-07-02: 50 ug via INTRAVENOUS

## 2016-07-02 MED ORDER — IOPAMIDOL (ISOVUE-300) INJECTION 61%
100.0000 mL | Freq: Once | INTRAVENOUS | Status: AC | PRN
  Administered 2016-07-02: 100 mL via INTRAVENOUS

## 2016-07-02 MED ORDER — ONDANSETRON 4 MG PO TBDP: 4.0000 mg | ORAL_TABLET | Freq: Four times a day (QID) | ORAL | Status: DC | PRN

## 2016-07-02 MED ORDER — PIPERACILLIN-TAZOBACTAM 3.375 G IVPB 30 MIN
3.3750 g | Freq: Three times a day (TID) | INTRAVENOUS | Status: DC
  Administered 2016-07-02 – 2016-07-03 (×2): 3.375 g via INTRAVENOUS
  Filled 2016-07-02 (×6): qty 50

## 2016-07-02 MED ORDER — FENTANYL CITRATE (PF) 100 MCG/2ML IJ SOLN
25.0000 ug | INTRAMUSCULAR | Status: DC | PRN
  Administered 2016-07-02 (×4): 25 ug via INTRAVENOUS

## 2016-07-02 MED ORDER — PIPERACILLIN-TAZOBACTAM 3.375 G IVPB 30 MIN
3.3750 g | Freq: Once | INTRAVENOUS | Status: AC
  Administered 2016-07-02: 3.375 g via INTRAVENOUS
  Filled 2016-07-02: qty 50

## 2016-07-02 MED ORDER — LIDOCAINE HCL (CARDIAC) 20 MG/ML IV SOLN
INTRAVENOUS | Status: DC | PRN
  Administered 2016-07-02: 50 mg via INTRAVENOUS

## 2016-07-02 MED ORDER — OXYCODONE HCL 5 MG PO TABS
5.0000 mg | ORAL_TABLET | ORAL | Status: DC | PRN
  Administered 2016-07-03: 5 mg via ORAL
  Filled 2016-07-02: qty 1

## 2016-07-02 MED ORDER — ONDANSETRON HCL 4 MG/2ML IJ SOLN: 4.0000 mg | Freq: Once | INTRAMUSCULAR | Status: DC | PRN

## 2016-07-02 MED ORDER — ROCURONIUM BROMIDE 100 MG/10ML IV SOLN
INTRAVENOUS | Status: DC | PRN
  Administered 2016-07-02: 20 mg via INTRAVENOUS

## 2016-07-02 MED ORDER — ONDANSETRON HCL 4 MG/2ML IJ SOLN
4.0000 mg | Freq: Four times a day (QID) | INTRAMUSCULAR | Status: DC | PRN
  Administered 2016-07-02: 4 mg via INTRAVENOUS

## 2016-07-02 MED ORDER — HEPARIN SODIUM (PORCINE) 5000 UNIT/ML IJ SOLN
5000.0000 [IU] | Freq: Three times a day (TID) | INTRAMUSCULAR | Status: DC
  Administered 2016-07-02 – 2016-07-03 (×2): 5000 [IU] via SUBCUTANEOUS
  Filled 2016-07-02 (×2): qty 1

## 2016-07-02 MED ORDER — FENTANYL CITRATE (PF) 100 MCG/2ML IJ SOLN
INTRAMUSCULAR | Status: AC
  Administered 2016-07-02: 25 ug via INTRAVENOUS
  Filled 2016-07-02: qty 2

## 2016-07-02 MED ORDER — LACTATED RINGERS IV SOLN
125.0000 mL/h | INTRAVENOUS | Status: DC
  Administered 2016-07-02: 125 mL/h via INTRAVENOUS
  Administered 2016-07-02: 19:00:00 via INTRAVENOUS
  Administered 2016-07-03: 125 mL/h via INTRAVENOUS

## 2016-07-02 MED ORDER — MIDAZOLAM HCL 2 MG/2ML IJ SOLN
INTRAMUSCULAR | Status: DC | PRN
  Administered 2016-07-02: 2 mg via INTRAVENOUS

## 2016-07-02 MED ORDER — FENTANYL CITRATE (PF) 100 MCG/2ML IJ SOLN
INTRAMUSCULAR | Status: DC | PRN
  Administered 2016-07-02 (×2): 50 ug via INTRAVENOUS
  Administered 2016-07-02: 100 ug via INTRAVENOUS

## 2016-07-02 MED ORDER — SUCCINYLCHOLINE CHLORIDE 20 MG/ML IJ SOLN
INTRAMUSCULAR | Status: DC | PRN
  Administered 2016-07-02: 100 mg via INTRAVENOUS

## 2016-07-02 MED ORDER — BUPIVACAINE-EPINEPHRINE (PF) 0.25% -1:200000 IJ SOLN
INTRAMUSCULAR | Status: DC | PRN
  Administered 2016-07-02: 14.5 mL via PERINEURAL

## 2016-07-02 MED ORDER — HYDROMORPHONE HCL 1 MG/ML IJ SOLN
0.5000 mg | INTRAMUSCULAR | Status: DC | PRN
  Administered 2016-07-02: 0.5 mg via INTRAVENOUS
  Filled 2016-07-02: qty 0.5

## 2016-07-02 MED ORDER — SODIUM CHLORIDE 0.9 % IV BOLUS (SEPSIS)
1000.0000 mL | Freq: Once | INTRAVENOUS | Status: AC
  Administered 2016-07-02: 1000 mL via INTRAVENOUS

## 2016-07-02 MED ORDER — SUGAMMADEX SODIUM 200 MG/2ML IV SOLN
INTRAVENOUS | Status: DC | PRN
  Administered 2016-07-02: 177 mg via INTRAVENOUS

## 2016-07-02 MED ORDER — PROPOFOL 10 MG/ML IV BOLUS
INTRAVENOUS | Status: DC | PRN
  Administered 2016-07-02: 200 mg via INTRAVENOUS

## 2016-07-02 MED ORDER — KETOROLAC TROMETHAMINE 30 MG/ML IJ SOLN
30.0000 mg | Freq: Four times a day (QID) | INTRAMUSCULAR | Status: DC
  Administered 2016-07-02 – 2016-07-03 (×2): 30 mg via INTRAVENOUS
  Filled 2016-07-02 (×2): qty 1

## 2016-07-02 SURGICAL SUPPLY — 39 items
CANISTER SUCT 1200ML W/VALVE (MISCELLANEOUS) ×3 IMPLANT
CHLORAPREP W/TINT 26ML (MISCELLANEOUS) ×3 IMPLANT
CUTTER FLEX LINEAR 45M (STAPLE) IMPLANT
DERMABOND ADVANCED (GAUZE/BANDAGES/DRESSINGS) ×2
DERMABOND ADVANCED .7 DNX12 (GAUZE/BANDAGES/DRESSINGS) ×1 IMPLANT
ELECT CAUTERY BLADE 6.4 (BLADE) ×3 IMPLANT
ELECT REM PT RETURN 9FT ADLT (ELECTROSURGICAL) ×3
ELECTRODE REM PT RTRN 9FT ADLT (ELECTROSURGICAL) ×1 IMPLANT
ENDOPOUCH RETRIEVER 10 (MISCELLANEOUS) ×3 IMPLANT
GLOVE SURG SYN 7.0 (GLOVE) ×3 IMPLANT
GLOVE SURG SYN 7.5  E (GLOVE) ×2
GLOVE SURG SYN 7.5 E (GLOVE) ×1 IMPLANT
GOWN STRL REUS W/ TWL LRG LVL3 (GOWN DISPOSABLE) ×2 IMPLANT
GOWN STRL REUS W/TWL LRG LVL3 (GOWN DISPOSABLE) ×4
IRRIGATION STRYKERFLOW (MISCELLANEOUS) ×1 IMPLANT
IRRIGATOR STRYKERFLOW (MISCELLANEOUS) ×3
IV NS 1000ML (IV SOLUTION) ×2
IV NS 1000ML BAXH (IV SOLUTION) ×1 IMPLANT
KIT RM TURNOVER STRD PROC AR (KITS) ×3 IMPLANT
LABEL OR SOLS (LABEL) ×3 IMPLANT
LIGASURE MARYLAND LAP STAND (ELECTROSURGICAL) ×3 IMPLANT
NDL HPO THNWL 1X22GA REG BVL (NEEDLE) ×1 IMPLANT
NEEDLE SAFETY 22GX1 (NEEDLE) ×2
NS IRRIG 500ML POUR BTL (IV SOLUTION) ×3 IMPLANT
PACK LAP CHOLECYSTECTOMY (MISCELLANEOUS) ×3 IMPLANT
PENCIL ELECTRO HAND CTR (MISCELLANEOUS) ×3 IMPLANT
RELOAD 45 VASCULAR/THIN (ENDOMECHANICALS) IMPLANT
RELOAD STAPLE TA45 3.5 REG BLU (ENDOMECHANICALS) ×3 IMPLANT
SCISSORS METZENBAUM CVD 33 (INSTRUMENTS) ×3 IMPLANT
SLEEVE ADV FIXATION 5X100MM (TROCAR) ×6 IMPLANT
SUT MNCRL 4-0 (SUTURE) ×2
SUT MNCRL 4-0 27XMFL (SUTURE) ×1
SUT VICRYL 0 AB UR-6 (SUTURE) ×3 IMPLANT
SUTURE MNCRL 4-0 27XMF (SUTURE) ×1 IMPLANT
TRAY FOLEY W/METER SILVER 16FR (SET/KITS/TRAYS/PACK) ×3 IMPLANT
TROCAR 130MM GELPORT  DAV (MISCELLANEOUS) IMPLANT
TROCAR XCEL BLUNT TIP 100MML (ENDOMECHANICALS) ×3 IMPLANT
TROCAR Z-THREAD OPTICAL 5X100M (TROCAR) ×3 IMPLANT
TUBING INSUFFLATOR HI FLOW (MISCELLANEOUS) ×3 IMPLANT

## 2016-07-02 SURGICAL SUPPLY — 42 items
ADHESIVE MASTISOL STRL (MISCELLANEOUS) ×3 IMPLANT
APPLIER CLIP 5 13 M/L LIGAMAX5 (MISCELLANEOUS) ×3
APPLIER CLIP LOGIC TI 5 (MISCELLANEOUS) ×3 IMPLANT
BLADE SURG SZ11 CARB STEEL (BLADE) ×3 IMPLANT
BULB RESERV EVAC DRAIN JP 100C (MISCELLANEOUS) IMPLANT
CANISTER SUCT 1200ML W/VALVE (MISCELLANEOUS) ×3 IMPLANT
CATH TRAY 16F METER LATEX (MISCELLANEOUS) ×3 IMPLANT
CHLORAPREP W/TINT 26ML (MISCELLANEOUS) ×3 IMPLANT
CLIP APPLIE 5 13 M/L LIGAMAX5 (MISCELLANEOUS) ×1 IMPLANT
CLOSURE WOUND 1/2 X4 (GAUZE/BANDAGES/DRESSINGS) ×1
CUTTER FLEX LINEAR 45M (STAPLE) ×3 IMPLANT
DRAIN CHANNEL JP 19F (MISCELLANEOUS) IMPLANT
DRESSING TELFA 4X3 1S ST N-ADH (GAUZE/BANDAGES/DRESSINGS) ×3 IMPLANT
DRSG TEGADERM 2-3/8X2-3/4 SM (GAUZE/BANDAGES/DRESSINGS) ×9 IMPLANT
ELECT REM PT RETURN 9FT ADLT (ELECTROSURGICAL) ×3
ELECTRODE REM PT RTRN 9FT ADLT (ELECTROSURGICAL) ×1 IMPLANT
ENDOPOUCH RETRIEVER 10 (MISCELLANEOUS) ×3 IMPLANT
GLOVE BIO SURGEON STRL SZ7.5 (GLOVE) ×9 IMPLANT
GLOVE INDICATOR 8.0 STRL GRN (GLOVE) ×6 IMPLANT
GOWN STRL REUS W/ TWL LRG LVL3 (GOWN DISPOSABLE) ×2 IMPLANT
GOWN STRL REUS W/TWL LRG LVL3 (GOWN DISPOSABLE) ×4
IRRIGATION STRYKERFLOW (MISCELLANEOUS) ×1 IMPLANT
IRRIGATOR STRYKERFLOW (MISCELLANEOUS) ×3
IV NS 1000ML (IV SOLUTION) ×2
IV NS 1000ML BAXH (IV SOLUTION) ×1 IMPLANT
KIT RM TURNOVER STRD PROC AR (KITS) ×3 IMPLANT
LABEL OR SOLS (LABEL) ×3 IMPLANT
NEEDLE HYPO 25X1 1.5 SAFETY (NEEDLE) ×3 IMPLANT
NEEDLE VERESS 14GA 120MM (NEEDLE) ×3 IMPLANT
NS IRRIG 500ML POUR BTL (IV SOLUTION) ×3 IMPLANT
PACK LAP CHOLECYSTECTOMY (MISCELLANEOUS) ×3 IMPLANT
RELOAD 45 VASCULAR/THIN (ENDOMECHANICALS) ×3 IMPLANT
RELOAD STAPLE TA45 3.5 REG BLU (ENDOMECHANICALS) ×3 IMPLANT
SLEEVE ENDOPATH XCEL 5M (ENDOMECHANICALS) IMPLANT
SLEEVE SCD COMPRESS THIGH MED (MISCELLANEOUS) ×3 IMPLANT
STRIP CLOSURE SKIN 1/2X4 (GAUZE/BANDAGES/DRESSINGS) ×2 IMPLANT
SUT MNCRL 4-0 (SUTURE) ×2
SUT MNCRL 4-0 27XMFL (SUTURE) ×1
SUTURE MNCRL 4-0 27XMF (SUTURE) ×1 IMPLANT
TROCAR XCEL 12X100 BLDLESS (ENDOMECHANICALS) ×3 IMPLANT
TROCAR XCEL NON-BLD 5MMX100MML (ENDOMECHANICALS) IMPLANT
TUBING INSUFFLATOR HI FLOW (MISCELLANEOUS) ×3 IMPLANT

## 2016-07-02 NOTE — Op Note (Signed)
laparascopic appendectomy   Stacey Moreno Date of operation:  07/02/2016  Indications: The patient presented with a history of  abdominal pain. Workup has revealed findings consistent with acute appendicitis.  Pre-operative Diagnosis: Acute appendicitis without mention of peritonitis  Post-operative Diagnosis: Same  Surgeon: Juanda Crumble T. Adonis Huguenin, MD, FACS  Anesthesia: General with endotracheal tube  Procedure Details  The patient was seen again in the preop area. The options of surgery versus observation were reviewed with the patient and/or family. The risks of bleeding, infection, recurrence of symptoms, negative laparoscopy, potential for an open procedure, bowel injury, abscess or infection, were all reviewed as well. The patient was taken to Operating Room, identified as Stacey Moreno and the procedure verified as laparoscopic appendectomy. A Time Out was held and the above information confirmed.  The patient was placed in the supine position and general anesthesia was induced.  Antibiotic prophylaxis was administered and VT E prophylaxis was in place. A Foley catheter was placed by the nursing staff.   The abdomen was prepped and draped in a sterile fashion. An infraumbilical incision was made. A Veress needle was placed and pneumoperitoneum was obtained. A 5 mm trocar port was placed without difficulty and the abdominal cavity was explored.  Under direct vision a 5 mm suprapubic port was placed and a 12 mm left lateral port was placed all under direct vision.  The appendix was identified and found to be acutely inflamed in the retrocecal position.  The appendix was carefully dissected. The base of the appendix was dissected out and divided with a standard load Endo GIA. The mesoappendix was divided with a vascular load Endo GIA. After firing the vascular load there is noted continued bleeding coming from the patient's side of the mesoappendix. This was attempted to be controlled with a  clip applier. However, this was unsuccessful. A LigaSure device was then opened and the visualized bleeding was controlled with LigaSure energy.  The appendix was passed out through the left lateral port site with the aid of an Endo Catch bag. The right lower quadrant and pelvis was then irrigated with copious amounts of normal saline which was aspirated. Inspection  failed to identify any additional bleeding and there were no signs of bowel injury.   Again the right lower quadrant was inspected there was no sign of bleeding or bowel injury therefore pneumoperitoneum was released, all ports were removed under direct visualization. The left lower quadrant site fascia was closed with a figure-of-eight 0 Vicryl suture and the skin incisions were approximated with subcuticular 4-0 Monocryl. Steri-Strips and Mastisol and sterile dressings were placed.  The patient tolerated the procedure well, there were no complications. The sponge lap and needle count were correct at the end of the procedure.  The patient was taken to the recovery room in stable condition to be admitted for continued care.  Findings: Acute appendicitis  Estimated Blood Loss: 40 mL                  Specimens: appendix         Complications:  None                  Clayburn Pert MD, FACS

## 2016-07-02 NOTE — ED Notes (Signed)
Pt undressed and placed in hospital gown

## 2016-07-02 NOTE — ED Provider Notes (Signed)
[facility]    CSN: [id number] Arrival date & time: 07/02/16  1113     History   Chief Complaint Chief Complaint  Patient presents with  . Abdominal Pain    HPI Stacey Moreno is a 36 y.o. female.   The history is provided by the patient.  Abdominal Pain  Pain location:  RLQ Pain quality: aching and cramping   Pain radiates to:  Does not radiate Pain severity:  Moderate Onset quality:  Sudden Duration:  12 hours Timing:  Constant Progression:  Worsening Chronicity:  New Relieved by:  Nothing Ineffective treatments:  Position changes and not moving Associated symptoms: chills   Associated symptoms: no chest pain, no constipation, no cough, no diarrhea, no dysuria, no fatigue, no fever, no flatus, no hematemesis, no hematochezia, no hematuria, no melena, no nausea, no shortness of breath, no sore throat, no vaginal discharge and no vomiting  Vaginal bleeding: currently at the end of her menses.   Risk factors: not elderly, not pregnant and no recent hospitalization     Past Medical History:  Diagnosis Date  . UTI (urinary tract infection)    H/O one    Patient Active Problem List   Diagnosis Date Noted  . PTSD (post-traumatic stress disorder) 05/26/2016  . Alcoholism and drug addiction in family 05/26/2016  . Acute alcoholic hepatitis 0000000  . 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 Hypertriglyceridemia 05/26/2016  . Smoker unmotivated to quit 05/26/2016  . Smokeless tobacco use 05/26/2016  . Obesity, Class I, BMI 30.0-34.9 (see actual BMI) 05/26/2016  . Alcohol use disorder, severe, dependence ([place]) 05/15/2016  . Generalized anxiety disorder 10/30/2014  . Encounter to establish care 10/30/2014  . Encounter for routine gynecological examination 10/30/2014  . Routine general medical examination at a health care facility 10/30/2014    Past Surgical History:  Procedure Laterality Date  . WISDOM TOOTH EXTRACTION      OB History     Gravida Para Term Preterm AB Living   0 0 0 0 0 0   SAB TAB Ectopic Multiple Live Births   0 0 0 0         Home Medications    Prior to Admission medications   Medication Sig Start Date End Date Taking? Authorizing Provider  omeprazole (PRILOSEC) 20 MG capsule Take 1 capsule (20 mg total) by mouth daily. 09/28/15   Versie Starks, PA-C  pravastatin (PRAVACHOL) 20 MG tablet TAKE 1 TABLET(20 MG) BY MOUTH DAILY 02/12/16   Versie Starks, PA-C  sertraline (ZOLOFT) 100 MG tablet Take 100 mg by mouth daily. 10/24/14   Historical Provider, MD  topiramate (TOPAMAX) 50 MG tablet Take 1 tablet (50 mg total) by mouth at bedtime. 05/26/16 06/25/17  Dara Hoyer, PA-C    Family History Family History  Problem Relation Age of Onset  . Alcohol abuse Father   . Alcohol abuse Maternal Aunt   . Cancer Paternal Aunt     breast  . Cancer Maternal Grandmother     breast  . Cancer Paternal Grandfather     lung    Social History Social History  Substance Use Topics  . Smoking status: Current Every Day Smoker    Types: Cigarettes  . Smokeless tobacco: Current User    Types: Snuff  . Alcohol use 12.0 oz/week    10 Cans of beer, 10 Shots of liquor per week     Allergies   Patient has no known allergies.  Review of Systems Review of Systems  Constitutional: Positive for chills. Negative for fatigue and fever.  HENT: Negative for sore throat.   Respiratory: Negative for cough and shortness of breath.   Cardiovascular: Negative for chest pain.  Gastrointestinal: Positive for abdominal pain. Negative for constipation, diarrhea, flatus, hematemesis, hematochezia, melena, nausea and vomiting.  Genitourinary: Negative for dysuria, hematuria and vaginal discharge. Vaginal bleeding: currently at the end of her menses.     Physical Exam Triage Vital Signs ED Triage Vitals  Enc Vitals Group     BP 07/02/16 1133 140/89     Pulse Rate 07/02/16 1133 100     Resp 07/02/16 1133 16      Temp 07/02/16 1133 98.1 F (36.7 C)     Temp Source 07/02/16 1133 Oral     SpO2 07/02/16 1133 99 %     Weight 07/02/16 1131 195 lb (88.5 kg)     Height 07/02/16 1131 5\' 7"  (1.702 m)     Head Circumference --      Peak Flow --      Pain Score 07/02/16 1133 3     Pain Loc --      Pain Edu? --      Excl. in [place]? --    No data found.   Updated Vital Signs BP 140/89 (BP Location: Right Arm)   Pulse 100   Temp 98.1 F (36.7 C) (Oral)   Resp 16   Ht 5\' 7"  (1.702 m)   Wt 195 lb (88.5 kg)   LMP 07/02/2016 (Exact Date)   SpO2 99%   BMI 30.54 kg/m   Visual Acuity Right Eye Distance:   Left Eye Distance:   Bilateral Distance:    Right Eye Near:   Left Eye Near:    Bilateral Near:     Physical Exam  Constitutional: She appears well-developed and well-nourished. No distress.  Abdominal: Soft. Bowel sounds are normal. She exhibits no distension and no mass. There is tenderness (right lower quadrant; McBurney's point). There is no rebound and no guarding.  Skin: She is not diaphoretic.  Nursing note and vitals reviewed.    UC Treatments / Results  Labs (all labs ordered are listed, but only abnormal results are displayed) Labs Reviewed  URINALYSIS, COMPLETE (UACMP) WITH MICROSCOPIC - Abnormal; Notable for the following:       Result Value   Hgb urine dipstick LARGE (*)    Squamous Epithelial / LPF 0-5 (*)    Bacteria, UA RARE (*)    All other components within normal limits  COMPREHENSIVE METABOLIC PANEL - Abnormal; Notable for the following:    Sodium 133 (*)    Glucose, Bld 100 (*)    All other components within normal limits  CBC WITH DIFFERENTIAL/PLATELET - Abnormal; Notable for the following:    WBC 13.0 (*)    RDW 15.3 (*)    Neutro Abs 10.0 (*)    All other components within normal limits  PREGNANCY, URINE    EKG  EKG Interpretation None       Radiology Ct Abdomen Pelvis W Contrast  Result Date: 07/02/2016 CLINICAL DATA:  Right mid and lower quadrant  abdominal pain for 1 day. Slight fever. EXAM: CT ABDOMEN AND PELVIS WITH CONTRAST TECHNIQUE: Multidetector CT imaging of the abdomen and pelvis was performed using the standard protocol following bolus administration of intravenous contrast. CONTRAST:  149mL ISOVUE-300 IOPAMIDOL (ISOVUE-300) INJECTION 61% COMPARISON:  None. FINDINGS: Lower chest:  No contributory findings. Hepatobiliary:  Possible hepatic steatosis, but certainty limited by contrast timing. No focal liver abnormality.No evidence of biliary obstruction or stone. Pancreas: Unremarkable. Spleen: Unremarkable. Adrenals/Urinary Tract: Negative adrenals. No hydronephrosis or stone. Unremarkable bladder. Stomach/Bowel: The appendix is distended to 13 mm outer wall diameter with avidly enhancing serosa and mild mesoappendix inflammation. No perforation or abscess. Trace pelvic fluid. The appendix extends posterior to the ascending colon. Vascular/Lymphatic: No acute vascular abnormality. No mass or adenopathy. Reproductive:Exophytic mass from the uterine fundus consistent with a fibroid, 27 mm in diameter. The mass is distinct from the normal-appearing ovaries. Other: No ascites or pneumoperitoneum. Musculoskeletal: No acute abnormalities. These results will be called to the ordering clinician or representative by the Radiologist Assistant, and communication documented in the PACS or zVision Dashboard. IMPRESSION: 1. Acute, non-perforated appendicitis. 2. 27 mm subserosal uterine fibroid. Electronically Signed   By: Monte Fantasia M.D.   On: 07/02/2016 14:48    Procedures Procedures (including critical care time)  Medications Ordered in UC Medications - No data to display   Initial Impression / Assessment and Plan / UC Course  I have reviewed the triage vital signs and the nursing notes.  Pertinent labs & imaging results that were available during my care of the patient were reviewed by me and considered in my medical decision making (see chart  for details).  Clinical Course       Final Clinical Impressions(s) / UC Diagnoses   Final diagnoses:  Acute appendicitis, uncomplicated    New Prescriptions Discharge Medication List as of 07/02/2016  3:07 PM     1. Labs/CT scan results (consistent with acute appendicitis) and diagnosis reviewed with patient; recommend patient go to ED for further evaluation and management, surgical evaluation; patient verbalizes understanding and will be taken directly to [facility] ED by private vehicle; patient in stable condition; report called to charge RN at [facility] ED   Norval Gable, MD 07/02/16 [phone]

## 2016-07-02 NOTE — ED Triage Notes (Signed)
Patient c/o right sided pain after eating dinner last night.  Patient denies N/V/D.

## 2016-07-02 NOTE — H&P (Signed)
Date of Admission:  07/02/2016  Reason for Admission:  Acute appendicitis  History of Present Illness: Stacey Moreno is a 36 y.o. female who presents with a two-day history of abdominal pain. Patient reports the pain started last night in the right lower quadrant towards the right flank. She presented to the urgent care center in [facility] today and had a CT scan which revealed acute appendicitis. She presented to emergency room for further evaluation. She reports having objective fevers with some sweats overnight and pain in the right lower quadrant. Otherwise she denies having any chest pain, shortness of breath, nausea, vomiting, diarrhea, constipation, blood in the stools, dysuria, hematuria.  Past Medical History: Past Medical History:  Diagnosis Date  . UTI (urinary tract infection)    H/O one     Past Surgical History: Past Surgical History:  Procedure Laterality Date  . WISDOM TOOTH EXTRACTION      Home Medications: Prior to Admission medications   Medication Sig Start Date End Date Taking? Authorizing Provider  Lysine 500 MG TABS Take 6 tablets by mouth daily.   Yes Historical Provider, MD  omeprazole (PRILOSEC) 20 MG capsule Take 1 capsule (20 mg total) by mouth daily. 09/28/15  Yes Versie Starks, PA-C  pravastatin (PRAVACHOL) 20 MG tablet TAKE 1 TABLET(20 MG) BY MOUTH DAILY 02/12/16  Yes Versie Starks, PA-C  sertraline (ZOLOFT) 100 MG tablet Take 100 mg by mouth daily. 10/24/14  Yes Historical Provider, MD  topiramate (TOPAMAX) 50 MG tablet Take 1 tablet (50 mg total) by mouth at bedtime. Patient taking differently: Take 50 mg by mouth daily as needed.  05/26/16 06/25/17 Yes Dara Hoyer, PA-C    Allergies: No Known Allergies  Social History:  reports that she has been smoking Cigarettes.  Her smokeless tobacco use includes Snuff. She reports that she drinks about 12.0 oz of alcohol per week . She reports that she does not use drugs.   Family History: Family History   Problem Relation Age of Onset  . Alcohol abuse Father   . Alcohol abuse Maternal Aunt   . Cancer Paternal Aunt     breast  . Cancer Maternal Grandmother     breast  . Cancer Paternal Grandfather     lung    Review of Systems: Review of Systems  Constitutional: Positive for fever. Negative for chills.  HENT: Negative for hearing loss.   Eyes: Negative for blurred vision.  Respiratory: Negative for cough and shortness of breath.   Cardiovascular: Negative for chest pain and leg swelling.  Gastrointestinal: Positive for abdominal pain. Negative for constipation, diarrhea, heartburn, nausea and vomiting.  Genitourinary: Negative for dysuria.  Musculoskeletal: Negative for myalgias.  Skin: Negative for rash.  Neurological: Negative for dizziness.  Psychiatric/Behavioral: Negative for depression.  All other systems reviewed and are negative.   Physical Exam BP (!) 148/77 (BP Location: Right Arm)   Pulse (!) 102   Temp 98.2 F (36.8 C) (Oral)   Resp 20   Ht 5\' 7"  (1.702 m)   Wt 88.5 kg (195 lb)   LMP 07/02/2016 (Exact Date)   SpO2 98%   BMI 30.54 kg/m  CONSTITUTIONAL: No acute distress HEENT:  Normocephalic, atraumatic, extraocular motion intact. NECK: Trachea is midline, and there is no jugular venous distension.  RESPIRATORY:  Lungs are clear, and breath sounds are equal bilaterally. Normal respiratory effort without pathologic use of accessory muscles. CARDIOVASCULAR: Heart is regular without murmurs, gallops, or rubs. GI: The abdomen is soft,  nondistended, with tenderness to palpation in the right lower quadrant. There were no palpable masses.  MUSCULOSKELETAL:  Normal muscle strength and tone in all four extremities.  No peripheral edema or cyanosis. SKIN: Skin turgor is normal. There are no pathologic skin lesions.  NEUROLOGIC:  Motor and sensation is grossly normal.  Cranial nerves are grossly intact. PSYCH:  Alert and oriented to person, place and time. Affect is  normal.  Laboratory Analysis: Results for orders placed or performed during the hospital encounter of 07/02/16 (from the past 24 hour(s))  Urinalysis, Complete w Microscopic     Status: Abnormal   Collection Time: 07/02/16 11:35 AM  Result Value Ref Range   Color, Urine YELLOW YELLOW   APPearance CLEAR CLEAR   Specific Gravity, Urine 1.015 1.005 - 1.030   pH 6.0 5.0 - 8.0   Glucose, UA NEGATIVE NEGATIVE mg/dL   Hgb urine dipstick LARGE (A) NEGATIVE   Bilirubin Urine NEGATIVE NEGATIVE   Ketones, ur NEGATIVE NEGATIVE mg/dL   Protein, ur NEGATIVE NEGATIVE mg/dL   Nitrite NEGATIVE NEGATIVE   Leukocytes, UA NEGATIVE NEGATIVE   Squamous Epithelial / LPF 0-5 (A) NONE SEEN   WBC, UA 0-5 0 - 5 WBC/hpf   RBC / HPF 0-5 0 - 5 RBC/hpf   Bacteria, UA RARE (A) NONE SEEN   Budding Yeast PRESENT   Pregnancy, urine     Status: None   Collection Time: 07/02/16 11:35 AM  Result Value Ref Range   Preg Test, Ur NEGATIVE NEGATIVE  Comprehensive metabolic panel     Status: Abnormal   Collection Time: 07/02/16 12:24 PM  Result Value Ref Range   Sodium 133 (L) 135 - 145 mmol/L   Potassium 3.7 3.5 - 5.1 mmol/L   Chloride 101 101 - 111 mmol/L   CO2 23 22 - 32 mmol/L   Glucose, Bld 100 (H) 65 - 99 mg/dL   BUN 7 6 - 20 mg/dL   Creatinine, Ser 0.64 0.44 - 1.00 mg/dL   Calcium 9.2 8.9 - 10.3 mg/dL   Total Protein 7.7 6.5 - 8.1 g/dL   Albumin 4.3 3.5 - 5.0 g/dL   AST 30 15 - 41 U/L   ALT 23 14 - 54 U/L   Alkaline Phosphatase 89 38 - 126 U/L   Total Bilirubin 1.1 0.3 - 1.2 mg/dL   GFR calc non Af Amer >60 >60 mL/min   GFR calc Af Amer >60 >60 mL/min   Anion gap 9 5 - 15  CBC with Differential     Status: Abnormal   Collection Time: 07/02/16 12:24 PM  Result Value Ref Range   WBC 13.0 (H) 3.6 - 11.0 K/uL   RBC 4.13 3.80 - 5.20 MIL/uL   Hemoglobin 12.9 12.0 - 16.0 g/dL   HCT 38.7 35.0 - 47.0 %   MCV 93.7 80.0 - 100.0 fL   MCH 31.3 26.0 - 34.0 pg   MCHC 33.3 32.0 - 36.0 g/dL   RDW 15.3 (H) 11.5 -  14.5 %   Platelets 249 150 - 440 K/uL   Neutrophils Relative % 76 %   Neutro Abs 10.0 (H) 1.4 - 6.5 K/uL   Lymphocytes Relative 18 %   Lymphs Abs 2.3 1.0 - 3.6 K/uL   Monocytes Relative 5 %   Monocytes Absolute 0.6 0.2 - 0.9 K/uL   Eosinophils Relative 1 %   Eosinophils Absolute 0.1 0 - 0.7 K/uL   Basophils Relative 0 %   Basophils Absolute 0.0 0 -  0.1 K/uL    Imaging: Ct Abdomen Pelvis W Contrast  Result Date: 07/02/2016 CLINICAL DATA:  Right mid and lower quadrant abdominal pain for 1 day. Slight fever. EXAM: CT ABDOMEN AND PELVIS WITH CONTRAST TECHNIQUE: Multidetector CT imaging of the abdomen and pelvis was performed using the standard protocol following bolus administration of intravenous contrast. CONTRAST:  160mL ISOVUE-300 IOPAMIDOL (ISOVUE-300) INJECTION 61% COMPARISON:  None. FINDINGS: Lower chest:  No contributory findings. Hepatobiliary: Possible hepatic steatosis, but certainty limited by contrast timing. No focal liver abnormality.No evidence of biliary obstruction or stone. Pancreas: Unremarkable. Spleen: Unremarkable. Adrenals/Urinary Tract: Negative adrenals. No hydronephrosis or stone. Unremarkable bladder. Stomach/Bowel: The appendix is distended to 13 mm outer wall diameter with avidly enhancing serosa and mild mesoappendix inflammation. No perforation or abscess. Trace pelvic fluid. The appendix extends posterior to the ascending colon. Vascular/Lymphatic: No acute vascular abnormality. No mass or adenopathy. Reproductive:Exophytic mass from the uterine fundus consistent with a fibroid, 27 mm in diameter. The mass is distinct from the normal-appearing ovaries. Other: No ascites or pneumoperitoneum. Musculoskeletal: No acute abnormalities. These results will be called to the ordering clinician or representative by the Radiologist Assistant, and communication documented in the PACS or zVision Dashboard. IMPRESSION: 1. Acute, non-perforated appendicitis. 2. 27 mm subserosal uterine  fibroid. Electronically Signed   By: Monte Fantasia M.D.   On: 07/02/2016 14:48    Assessment and Plan: This is a 36 y.o. female who presents with a two-day history of abdominal pain and findings consistent with acute appendicitis. I have personally reviewed the patient's laboratory studies and imaging studies.  -Patient will be admitted to general surgery team and will be taken to the operating room tonight for laparoscopic appendectomy. The risks and benefits of the procedure including risk of bleeding, infection, injury to surrounding structures were expanded to the patient and she is willing to proceed. -Patient has received a dose of IV Zosyn in the emergency room. -Patient will be nothing by mouth with IV fluid hydration. -Patient understands this plan and all of her questions are answered   Melvyn Neth, [place]

## 2016-07-02 NOTE — Discharge Instructions (Signed)
Recommend patient go to ED for further evaluation/management, surgical evaluation

## 2016-07-02 NOTE — Progress Notes (Signed)
Case discussed with Dr. Hampton Abbot. Patient seen and examined.  Agree with assessment of acute appendicitis.  The procedure was discussed in detail with the patient and her family. All questions answered to their satisfaction.  Plan for laparoscopic appendectomy and then observation.  Clayburn Pert, MD [facility]

## 2016-07-02 NOTE — ED Provider Notes (Signed)
[facility] Emergency Department Provider Note  ____________________________________________  Time seen: Approximately 4:54 PM  I have reviewed the triage vital signs and the nursing notes.   HISTORY  Chief Complaint Abdominal Pain    HPI Stacey Moreno is a 36 y.o. female , nonpregnant and otherwise healthy, sent from [facility] urgent care for acute appendicitis. The patient reports that since yesterday she has had a "stabbing" pain in the right lower quadrant associated with possible subjective fever. She has not had any nausea, vomiting, diarrhea, fevers or chills. She had blood work showing a white blood cell count of 13, and his CT showing an acute nonperforated appendicitis so was transferred here for further evaluation and treatment. The patient has not had anything to eat or drink today.   Past Medical History:  Diagnosis Date  . UTI (urinary tract infection)    H/O one    Patient Active Problem List   Diagnosis Date Noted  . Acute appendicitis 07/02/2016  . PTSD (post-traumatic stress disorder) 05/26/2016  . Alcoholism and drug addiction in family 05/26/2016  . Acute alcoholic hepatitis 0000000  . 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 Hypertriglyceridemia 05/26/2016  . Smoker unmotivated to quit 05/26/2016  . Smokeless tobacco use 05/26/2016  . Obesity, Class I, BMI 30.0-34.9 (see actual BMI) 05/26/2016  . Alcohol use disorder, severe, dependence ([place]) 05/15/2016  . Generalized anxiety disorder 10/30/2014  . Encounter to establish care 10/30/2014  . Encounter for routine gynecological examination 10/30/2014  . Routine general medical examination at a health care facility 10/30/2014    Past Surgical History:  Procedure Laterality Date  . WISDOM TOOTH EXTRACTION      Current Outpatient Rx  . Order #: [id number] Class: Historical Med  . Order #: [id number] Class: Normal  . Order #: [id number] Class: Normal  . Order #:  [id number] Class: Historical Med  . Order #: [id number] Class: Normal    Allergies Patient has no known allergies.  Family History  Problem Relation Age of Onset  . Alcohol abuse Father   . Alcohol abuse Maternal Aunt   . Cancer Paternal Aunt     breast  . Cancer Maternal Grandmother     breast  . Cancer Paternal Grandfather     lung    Social History Social History  Substance Use Topics  . Smoking status: Current Every Day Smoker    Types: Cigarettes  . Smokeless tobacco: Current User    Types: Snuff  . Alcohol use 12.0 oz/week    10 Cans of beer, 10 Shots of liquor per week    Review of Systems Constitutional: No fever/chills.No lightheadedness or syncope. Eyes: No visual changes. ENT: No sore throat. No congestion or rhinorrhea. Cardiovascular: Denies chest pain. Denies palpitations. Respiratory: Denies shortness of breath.  No cough. Gastrointestinal: Positive right lower quadrant abdominal pain.  No nausea, no vomiting.  No diarrhea.  No constipation. Genitourinary: Negative for dysuria. Musculoskeletal: Negative for back pain. Skin: Negative for rash. Neurological: Negative for headaches. No focal numbness, tingling or weakness.   10-point ROS otherwise negative.  ____________________________________________   PHYSICAL EXAM:  VITAL SIGNS: ED Triage Vitals [07/02/16 1554]  Enc Vitals Group     BP (!) 148/77     Pulse Rate (!) 102     Resp 20     Temp 98.2 F (36.8 C)     Temp Source Oral     SpO2 98 %     Weight 195 lb (88.5 kg)  Height 5\' 7"  (1.702 m)     Head Circumference      Peak Flow      Pain Score 6     Pain Loc      Pain Edu?      Excl. in [place]?     Constitutional: Alert and oriented. Well appearing and in no acute distress. Answers questions appropriately. Eyes: Conjunctivae are normal.  EOMI. No scleral icterus. Head: Atraumatic. Nose: No congestion/rhinnorhea. Mouth/Throat: Mucous membranes are moist.  Neck: No stridor.  Supple.    Cardiovascular: Normal rate, regular rhythm. No murmurs, rubs or gallops.  Respiratory: Normal respiratory effort.  No accessory muscle use or retractions. Lungs CTAB.  No wheezes, rales or ronchi. Gastrointestinal: Overweight. Soft, and nondistended. Tender to palpation in the right lower quadrant. No guarding or rebound.  No peritoneal signs. Musculoskeletal: No LE edema.  Neurologic:  A&Ox3.  Speech is clear.  Face and smile are symmetric.  EOMI.  Moves all extremities well. Skin:  Skin is warm, dry and intact. No rash noted. Psychiatric: Mood and affect are normal. Speech and behavior are normal.  Normal judgement.  ____________________________________________   LABS (all labs ordered are listed, but only abnormal results are displayed)  Labs Reviewed  APTT  PROTIME-INR  TYPE AND SCREEN   ____________________________________________  EKG  ED ECG REPORT I, Eula Listen, the attending physician, personally viewed and interpreted this ECG.   Date: 07/02/2016  EKG Time: 1808  Rate: 82  Rhythm: normal sinus rhythm  Axis: normal  Intervals:none  ST&T Change: Nonspecific T-wave inversion in V1. No ST elevation.  ____________________________________________  RADIOLOGY  Ct Abdomen Pelvis W Contrast  Result Date: 07/02/2016 CLINICAL DATA:  Right mid and lower quadrant abdominal pain for 1 day. Slight fever. EXAM: CT ABDOMEN AND PELVIS WITH CONTRAST TECHNIQUE: Multidetector CT imaging of the abdomen and pelvis was performed using the standard protocol following bolus administration of intravenous contrast. CONTRAST:  117mL ISOVUE-300 IOPAMIDOL (ISOVUE-300) INJECTION 61% COMPARISON:  None. FINDINGS: Lower chest:  No contributory findings. Hepatobiliary: Possible hepatic steatosis, but certainty limited by contrast timing. No focal liver abnormality.No evidence of biliary obstruction or stone. Pancreas: Unremarkable. Spleen: Unremarkable. Adrenals/Urinary Tract: Negative  adrenals. No hydronephrosis or stone. Unremarkable bladder. Stomach/Bowel: The appendix is distended to 13 mm outer wall diameter with avidly enhancing serosa and mild mesoappendix inflammation. No perforation or abscess. Trace pelvic fluid. The appendix extends posterior to the ascending colon. Vascular/Lymphatic: No acute vascular abnormality. No mass or adenopathy. Reproductive:Exophytic mass from the uterine fundus consistent with a fibroid, 27 mm in diameter. The mass is distinct from the normal-appearing ovaries. Other: No ascites or pneumoperitoneum. Musculoskeletal: No acute abnormalities. These results will be called to the ordering clinician or representative by the Radiologist Assistant, and communication documented in the PACS or zVision Dashboard. IMPRESSION: 1. Acute, non-perforated appendicitis. 2. 27 mm subserosal uterine fibroid. Electronically Signed   By: Monte Fantasia M.D.   On: 07/02/2016 14:48    ____________________________________________   PROCEDURES  Procedure(s) performed: None  Procedures  Critical Care performed: No ____________________________________________   INITIAL IMPRESSION / ASSESSMENT AND PLAN / ED COURSE  Pertinent labs & imaging results that were available during my care of the patient were reviewed by me and considered in my medical decision making (see chart for details).  36 y.o. female with acute, uncomplicated appendicitis. I have ordered Zosyn and spoken with Dr. Hampton Abbot, the general surgeon on call who admitted the patient for surgery.  ____________________________________________  FINAL CLINICAL  IMPRESSION(S) / ED DIAGNOSES  Final diagnoses:  Acute appendicitis, unspecified acute appendicitis type    Clinical Course       NEW MEDICATIONS STARTED DURING THIS VISIT:  New Prescriptions   No medications on file      Eula Listen, MD 07/02/16 1812

## 2016-07-02 NOTE — Anesthesia Procedure Notes (Signed)
Procedure Name: Intubation Date/Time: 07/02/2016 7:34 PM Performed by: Lendon Colonel Pre-anesthesia Checklist: Patient identified, Emergency Drugs available, Suction available, Timeout performed and Patient being monitored Patient Re-evaluated:Patient Re-evaluated prior to inductionOxygen Delivery Method: Circle system utilized Preoxygenation: Pre-oxygenation with 100% oxygen Intubation Type: IV induction and Rapid sequence Laryngoscope Size: Miller and 2 Grade View: Grade I Tube type: Oral Tube size: 7.0 mm Number of attempts: 1 Airway Equipment and Method: Stylet

## 2016-07-02 NOTE — ED Triage Notes (Signed)
Pt sent from [facility] with appendicitis, pt sent for admission

## 2016-07-02 NOTE — ED Notes (Addendum)
Prior authorization obtained, authorization number [id number] and is good through the 1st of April 2018. CT of abd./pelvis w/contrast scheduled.

## 2016-07-02 NOTE — Anesthesia Procedure Notes (Signed)
Performed by: Lysle Yero       

## 2016-07-02 NOTE — Transfer of Care (Signed)
Immediate Anesthesia Transfer of Care Note  Patient: Stacey Moreno  Procedure(s) Performed: Procedure(s): APPENDECTOMY LAPAROSCOPIC (N/A)  Patient Location: PACU  Anesthesia Type:General  Level of Consciousness: awake, alert , oriented and patient cooperative  Airway & Oxygen Therapy: Patient Spontanous Breathing  Post-op Assessment: Report given to RN and Post -op Vital signs reviewed and stable  Post vital signs: Reviewed and stable  Last Vitals:  Vitals:   07/02/16 1554 07/02/16 2033  BP: (!) 148/77 133/88  Pulse: (!) 102 (!) 109  Resp: 20 18  Temp: 36.8 C     Last Pain:  Vitals:   07/02/16 1633  TempSrc:   PainSc: 6          Complications: No apparent anesthesia complications

## 2016-07-02 NOTE — Brief Op Note (Signed)
07/02/2016  8:28 PM  PATIENT:  Stacey Moreno  36 y.o. female  PRE-OPERATIVE DIAGNOSIS:  acute appendicitis  POST-OPERATIVE DIAGNOSIS:  acute appendicitis  PROCEDURE:  Procedure(s): APPENDECTOMY LAPAROSCOPIC (N/A)  SURGEON:  Surgeon(s) and Role:    * Clayburn Pert, MD - Primary  PHYSICIAN ASSISTANT:   ASSISTANTS: none   ANESTHESIA:   general  EBL:  Total I/O In: -  Out: 120 [Urine:80; Blood:40]  BLOOD ADMINISTERED:none  DRAINS: none   LOCAL MEDICATIONS USED:  MARCAINE   , XYLOCAINE  and Amount: 29 ml  SPECIMEN:  Source of Specimen:  appendix  DISPOSITION OF SPECIMEN:  PATHOLOGY  COUNTS:  YES  TOURNIQUET:  * No tourniquets in log *  DICTATION: .Dragon Dictation  PLAN OF CARE: Admit for overnight observation  PATIENT DISPOSITION:  PACU - hemodynamically stable.   Delay start of Pharmacological VTE agent (>24hrs) due to surgical blood loss or risk of bleeding: no

## 2016-07-02 NOTE — Anesthesia Preprocedure Evaluation (Signed)
Anesthesia Evaluation  Patient identified by MRN, date of birth, ID band Patient awake    Reviewed: Allergy & Precautions, H&P , NPO status , Patient's Chart, lab work & pertinent test results, reviewed documented beta blocker date and time   Airway Mallampati: II  TM Distance: >3 FB Neck ROM: full    Dental  (+) Teeth Intact   Pulmonary neg pulmonary ROS, Current Smoker,    Pulmonary exam normal        Cardiovascular Exercise Tolerance: Good negative cardio ROS Normal cardiovascular exam Rhythm:regular Rate:Normal     Neuro/Psych negative neurological ROS  negative psych ROS   GI/Hepatic negative GI ROS, Neg liver ROS,   Endo/Other  negative endocrine ROS  Renal/GU negative Renal ROS  negative genitourinary   Musculoskeletal   Abdominal   Peds  Hematology negative hematology ROS (+)   Anesthesia Other Findings Past Medical History: No date: UTI (urinary tract infection)     Comment: H/O one Past Surgical History: No date: WISDOM TOOTH EXTRACTION BMI    Body Mass Index:  30.54 kg/m     Reproductive/Obstetrics negative OB ROS                             Anesthesia Physical Anesthesia Plan  ASA: II and emergent  Anesthesia Plan: General ETT   Post-op Pain Management:    Induction:   Airway Management Planned:   Additional Equipment:   Intra-op Plan:   Post-operative Plan:   Informed Consent: I have reviewed the patients History and Physical, chart, labs and discussed the procedure including the risks, benefits and alternatives for the proposed anesthesia with the patient or authorized representative who has indicated his/her understanding and acceptance.   Dental Advisory Given  Plan Discussed with: CRNA  Anesthesia Plan Comments:         Anesthesia Quick Evaluation

## 2016-07-02 NOTE — ED Notes (Signed)
Pt came from urgent care - had blood drawn there and a CT scan - told to come to ER to see the surgeon for appendicitis

## 2016-07-03 ENCOUNTER — Encounter: Payer: Self-pay | Admitting: General Surgery

## 2016-07-03 ENCOUNTER — Other Ambulatory Visit (HOSPITAL_COMMUNITY): Payer: Managed Care, Other (non HMO)

## 2016-07-03 MED ORDER — LACTATED RINGERS IV SOLN
INTRAVENOUS | Status: DC
  Administered 2016-07-03: 11:00:00 via INTRAVENOUS

## 2016-07-03 MED ORDER — OXYCODONE HCL 5 MG PO TABS: 5.0000 mg | ORAL_TABLET | ORAL | 0 refills | Status: DC | PRN

## 2016-07-03 MED ORDER — IBUPROFEN 600 MG PO TABS: 600.0000 mg | ORAL_TABLET | Freq: Three times a day (TID) | ORAL | 0 refills | Status: AC | PRN

## 2016-07-03 NOTE — Progress Notes (Signed)
Patient ambulated in hallway 500 feet and is passing flatus.  Patient tolerated ambulation well and is resting soundly.  Christene Slates 07/03/2016  6:09 AM

## 2016-07-03 NOTE — Progress Notes (Signed)
07/03/2016  Subjective: Patient is 1 Day Post-Op status post laparoscopic appendectomy with Dr. Adonis Huguenin. No acute events overnight. Patient reports that her pain is much improved. She has ambulated and passed flatus already. She tolerated clears with no issues.  Vital signs: Temp:  [96.8 F (36 C)-98.7 F (37.1 C)] 98.3 F (36.8 C) (01/04 0450) Pulse Rate:  [70-109] 70 (01/04 0450) Resp:  [15-26] 19 (01/04 0450) BP: (104-148)/(55-89) 106/80 (01/04 0450) SpO2:  [95 %-100 %] 95 % (01/04 0450) Weight:  [88.5 kg (195 lb)] 88.5 kg (195 lb) (01/03 1554)   Intake/Output: 01/03 0701 - 01/04 0700 In: [id number] [P.O.:120; I.V.:800] Out: 620 [Urine:580; Blood:40] Last BM Date: 07/02/16  Physical Exam: Constitutional: No acute distress Abdomen:  Off, nondistended, appropriately tender to palpation. Incisions are covered with gauze dressing. Periumbilical dressing is stained with serosanguineous fluid but otherwise incisions are clean dry and intact.  Labs:   Recent Labs  07/02/16 1224  WBC 13.0*  HGB 12.9  HCT 38.7  PLT 249    Recent Labs  07/02/16 1224  NA 133*  K 3.7  CL 101  CO2 23  GLUCOSE 100*  BUN 7  CREATININE 0.64  CALCIUM 9.2    Recent Labs  07/02/16 1748  LABPROT 13.6  INR 1.04    Imaging: Ct Abdomen Pelvis W Contrast  Result Date: 07/02/2016 CLINICAL DATA:  Right mid and lower quadrant abdominal pain for 1 day. Slight fever. EXAM: CT ABDOMEN AND PELVIS WITH CONTRAST TECHNIQUE: Multidetector CT imaging of the abdomen and pelvis was performed using the standard protocol following bolus administration of intravenous contrast. CONTRAST:  167mL ISOVUE-300 IOPAMIDOL (ISOVUE-300) INJECTION 61% COMPARISON:  None. FINDINGS: Lower chest:  No contributory findings. Hepatobiliary: Possible hepatic steatosis, but certainty limited by contrast timing. No focal liver abnormality.No evidence of biliary obstruction or stone. Pancreas: Unremarkable. Spleen: Unremarkable.  Adrenals/Urinary Tract: Negative adrenals. No hydronephrosis or stone. Unremarkable bladder. Stomach/Bowel: The appendix is distended to 13 mm outer wall diameter with avidly enhancing serosa and mild mesoappendix inflammation. No perforation or abscess. Trace pelvic fluid. The appendix extends posterior to the ascending colon. Vascular/Lymphatic: No acute vascular abnormality. No mass or adenopathy. Reproductive:Exophytic mass from the uterine fundus consistent with a fibroid, 27 mm in diameter. The mass is distinct from the normal-appearing ovaries. Other: No ascites or pneumoperitoneum. Musculoskeletal: No acute abnormalities. These results will be called to the ordering clinician or representative by the Radiologist Assistant, and communication documented in the PACS or zVision Dashboard. IMPRESSION: 1. Acute, non-perforated appendicitis. 2. 27 mm subserosal uterine fibroid. Electronically Signed   By: Monte Fantasia M.D.   On: 07/02/2016 14:48    Assessment/Plan: 36 year old female status post laparoscopic appendectomy.  -Advance patient's diet to regular. -Decrease IV fluids and discontinue when the patient is tolerating by mouth diet. -Likely discharge to home today   Melvyn Neth, [place]

## 2016-07-03 NOTE — Progress Notes (Signed)
Pt A and O x 4. VSS. Pt tolerating diet well. No complaints of pain or nausea. IV removed intact, prescriptions given. Pt voiced understanding of discharge instructions with no further questions. Dressings changed piorto discharge. Pt discharged via wheelchair with nurse tech.

## 2016-07-03 NOTE — Discharge Summary (Signed)
Patient ID: Stacey Moreno MRN: [id number] DOB/AGE: [birth year] 36 y.o.  Admit date: 07/02/2016 Discharge date: 07/03/2016   Discharge Diagnoses:  Active Problems:   Acute appendicitis   Procedures: Laparoscopic appendectomy  Hospital Course: Patient was admitted on 1/3 with acute appendicitis and a one-day history of abdominal pain. She was taken to the operating room on 1/3 and underwent a laparoscopic appendectomy with no complications. The patient tolerated the procedure well. Her diet was slowly advanced and her IV fluids were discontinued. She was ambulating in having flatus. Prior to discharge she was tolerating a regular diet, had her pain well controlled with oral medication, was ambulating and voiding, and was deemed ready for discharge to home.  Consults: None  Disposition: 01-Home or Self Care  Discharge Instructions    Call MD for:  difficulty breathing, headache or visual disturbances    Complete by:  As directed    Call MD for:  persistant nausea and vomiting    Complete by:  As directed    Call MD for:  redness, tenderness, or signs of infection (pain, swelling, redness, odor or green/yellow discharge around incision site)    Complete by:  As directed    Call MD for:  severe uncontrolled pain    Complete by:  As directed    Call MD for:  temperature >100.4    Complete by:  As directed    Diet - low sodium heart healthy    Complete by:  As directed    Discharge instructions    Complete by:  As directed    Patient may shower, but do not scrub wounds heavily and dab dry only. Allow Steri-Strips to fall off on their own.   Driving Restrictions    Complete by:  As directed    Do not drive while taking narcotics for pain control   Increase activity slowly    Complete by:  As directed    Lifting restrictions    Complete by:  As directed    No heavy lifting of more than 10-15 pounds for 4 weeks   Remove dressing in 24 hours    Complete by:  As directed       Allergies as of 07/03/2016   No Known Allergies     Medication List    TAKE these medications   ibuprofen 600 MG tablet Commonly known as:  ADVIL,MOTRIN Take 1 tablet (600 mg total) by mouth every 8 (eight) hours as needed for fever or mild pain.   Lysine 500 MG Tabs Take 6 tablets by mouth daily.   omeprazole 20 MG capsule Commonly known as:  PRILOSEC Take 1 capsule (20 mg total) by mouth daily.   oxyCODONE 5 MG immediate release tablet Commonly known as:  Oxy IR/ROXICODONE Take 1-2 tablets (5-10 mg total) by mouth every 4 (four) hours as needed for moderate pain.   pravastatin 20 MG tablet Commonly known as:  PRAVACHOL TAKE 1 TABLET(20 MG) BY MOUTH DAILY   sertraline 100 MG tablet Commonly known as:  ZOLOFT Take 100 mg by mouth daily.   topiramate 50 MG tablet Commonly known as:  TOPAMAX Take 1 tablet (50 mg total) by mouth at bedtime. What changed:  when to take this  reasons to take this      Uriah, MD Follow up in 2 week(s).   Specialty:  General Surgery Contact information: [address] [place] [address] [phone]

## 2016-07-04 LAB — SURGICAL PATHOLOGY

## 2016-07-07 ENCOUNTER — Other Ambulatory Visit (HOSPITAL_COMMUNITY): Payer: Managed Care, Other (non HMO)

## 2016-07-08 NOTE — Anesthesia Postprocedure Evaluation (Signed)
Anesthesia Post Note  Patient: Stacey Moreno  Procedure(s) Performed: Procedure(s) (LRB): APPENDECTOMY LAPAROSCOPIC (N/A)  Patient location during evaluation: PACU Anesthesia Type: General Level of consciousness: awake and alert Pain management: pain level controlled Vital Signs Assessment: post-procedure vital signs reviewed and stable Respiratory status: spontaneous breathing, nonlabored ventilation, respiratory function stable and patient connected to nasal cannula oxygen Cardiovascular status: blood pressure returned to baseline and stable Postop Assessment: no signs of nausea or vomiting Anesthetic complications: no     Last Vitals:  Vitals:   07/03/16 1106 07/03/16 1253  BP: 121/66 125/79  Pulse: 95 85  Resp: 18 20  Temp: 36.7 C 36.7 C    Last Pain:  Vitals:   07/03/16 1337  TempSrc:   PainSc: 3                  Molli Barrows

## 2016-07-09 ENCOUNTER — Other Ambulatory Visit (HOSPITAL_COMMUNITY): Payer: Managed Care, Other (non HMO)

## 2016-07-10 ENCOUNTER — Other Ambulatory Visit (HOSPITAL_COMMUNITY): Payer: Managed Care, Other (non HMO)

## 2016-07-11 ENCOUNTER — Other Ambulatory Visit: Payer: Self-pay | Admitting: Physician Assistant

## 2016-07-11 DIAGNOSIS — K219 Gastro-esophageal reflux disease without esophagitis: Secondary | ICD-10-CM

## 2016-07-11 NOTE — Telephone Encounter (Signed)
Med refill for omeprazole and pravastatin approved, only 30d rx for pravastatin as we need to recheck lipid levels (fasting) and liver enzymes

## 2016-07-14 ENCOUNTER — Other Ambulatory Visit (HOSPITAL_COMMUNITY): Payer: Managed Care, Other (non HMO)

## 2016-07-16 ENCOUNTER — Other Ambulatory Visit (HOSPITAL_COMMUNITY): Payer: Managed Care, Other (non HMO)

## 2016-07-17 ENCOUNTER — Ambulatory Visit: Payer: 59 | Admitting: Surgery

## 2016-07-17 ENCOUNTER — Ambulatory Visit: Payer: Managed Care, Other (non HMO) | Admitting: Surgery

## 2016-07-17 ENCOUNTER — Other Ambulatory Visit (HOSPITAL_COMMUNITY): Payer: Managed Care, Other (non HMO)

## 2016-07-21 ENCOUNTER — Other Ambulatory Visit (HOSPITAL_COMMUNITY): Payer: Managed Care, Other (non HMO)

## 2016-07-23 ENCOUNTER — Ambulatory Visit (INDEPENDENT_AMBULATORY_CARE_PROVIDER_SITE_OTHER): Payer: Managed Care, Other (non HMO) | Admitting: Surgery

## 2016-07-23 ENCOUNTER — Encounter: Payer: Self-pay | Admitting: Surgery

## 2016-07-23 ENCOUNTER — Other Ambulatory Visit (HOSPITAL_COMMUNITY): Payer: Managed Care, Other (non HMO)

## 2016-07-23 VITALS — BP 130/84 | HR 105 | Temp 98.1°F | Ht 67.0 in | Wt 208.2 lb

## 2016-07-23 DIAGNOSIS — Z9049 Acquired absence of other specified parts of digestive tract: Secondary | ICD-10-CM | POA: Insufficient documentation

## 2016-07-23 DIAGNOSIS — Z8719 Personal history of other diseases of the digestive system: Secondary | ICD-10-CM | POA: Insufficient documentation

## 2016-07-23 NOTE — Patient Instructions (Addendum)
Please call our office with any questions or concerns. No lifting over 15 pounds until 07/30/16.   Marland Kitchen

## 2016-07-23 NOTE — Progress Notes (Signed)
07/23/2016  HPI: Patient is status post laparoscopic appendectomy with Dr. Adonis Huguenin 1/3. She presents today for follow-up appointment. Patient reports that he's been doing well with no significant pain over the incisions. She has been eating well with normal appetite, no nausea or vomiting, and good bowel movements. She reports that she had 1 episode where she had some more soreness over the left lower quadrant incision but otherwise no other major issues.  Vital signs: BP 130/84   Pulse (!) 105   Temp 98.1 F (36.7 C) (Oral)   Ht 5\' 7"  (1.702 m)   Wt 94.4 kg (208 lb 3.2 oz)   LMP 07/02/2016 (Exact Date) Comment: neg preg test  BMI 32.61 kg/m    Physical Exam: Constitutional: No acute distress Abdomen: Soft, nondistended, nontender to palpation. All 3 incisions are clean dry and intact. Left lower quadrant incision has a small scab that came off on palpation but otherwise no evidence of infection.  Assessment/Plan: 36 year old female status post laparoscopic appendectomy.  -Patient still has a no heavy lifting restriction of more than 10-15 pounds until 1/31. She may resume her normal activities after that. -Pathology reviewed with the patient. Negative for malignancy. -Patient will follow up with [place] on an as-needed basis.   Melvyn Neth, [place]

## 2016-07-24 ENCOUNTER — Other Ambulatory Visit (HOSPITAL_COMMUNITY): Payer: Managed Care, Other (non HMO)

## 2016-07-28 ENCOUNTER — Other Ambulatory Visit (HOSPITAL_COMMUNITY): Payer: Managed Care, Other (non HMO)

## 2016-07-30 ENCOUNTER — Other Ambulatory Visit (HOSPITAL_COMMUNITY): Payer: Managed Care, Other (non HMO)

## 2016-07-31 ENCOUNTER — Other Ambulatory Visit (HOSPITAL_COMMUNITY): Payer: Managed Care, Other (non HMO)

## 2016-08-04 ENCOUNTER — Other Ambulatory Visit (HOSPITAL_COMMUNITY): Payer: Managed Care, Other (non HMO)

## 2016-08-06 ENCOUNTER — Other Ambulatory Visit (HOSPITAL_COMMUNITY): Payer: Managed Care, Other (non HMO)

## 2016-08-07 ENCOUNTER — Other Ambulatory Visit (HOSPITAL_COMMUNITY): Payer: Managed Care, Other (non HMO)

## 2016-08-11 ENCOUNTER — Other Ambulatory Visit (HOSPITAL_COMMUNITY): Payer: Managed Care, Other (non HMO)

## 2016-08-13 ENCOUNTER — Other Ambulatory Visit (HOSPITAL_COMMUNITY): Payer: Managed Care, Other (non HMO)

## 2016-08-14 ENCOUNTER — Other Ambulatory Visit (HOSPITAL_COMMUNITY): Payer: Managed Care, Other (non HMO)

## 2016-08-17 ENCOUNTER — Other Ambulatory Visit: Payer: Self-pay | Admitting: Physician Assistant

## 2016-08-17 DIAGNOSIS — K219 Gastro-esophageal reflux disease without esophagitis: Secondary | ICD-10-CM

## 2016-08-18 NOTE — Telephone Encounter (Signed)
Med refill for omeprazole and pravastatin approved, we have not been notified yet of pt's dismissal from [place]

## 2016-12-30 ENCOUNTER — Ambulatory Visit
Admission: EM | Admit: 2016-12-30 | Discharge: 2016-12-30 | Disposition: A | Discharge status: Home / Self Care | Payer: BC Managed Care – PPO | Attending: Family Medicine | Admitting: Family Medicine

## 2016-12-30 ENCOUNTER — Encounter: Payer: Self-pay | Admitting: *Deleted

## 2016-12-30 DIAGNOSIS — M62830 Muscle spasm of back: Secondary | ICD-10-CM | POA: Diagnosis not present

## 2016-12-30 DIAGNOSIS — S39012A Strain of muscle, fascia and tendon of lower back, initial encounter: Secondary | ICD-10-CM

## 2016-12-30 MED ORDER — ORPHENADRINE CITRATE ER 100 MG PO TB12: 100.0000 mg | ORAL_TABLET | Freq: Two times a day (BID) | ORAL | 0 refills | Status: DC

## 2016-12-30 NOTE — ED Provider Notes (Signed)
CSN: [id number]     Arrival date & time 12/30/16  0815 History   None    Chief Complaint  Patient presents with  . Back Pain   (Consider location/radiation/quality/duration/timing/severity/associated sxs/prior Treatment) The history is provided by the patient.  Back Pain  Location:  Thoracic spine and lumbar spine Quality:  Burning and cramping Radiates to:  Does not radiate Pain severity:  Moderate Pain is:  Unable to specify Onset quality:  Gradual Timing:  Constant Progression:  Waxing and waning Chronicity:  New Context: recent illness   Context comment:  Cough and vomited adn back started hurtng afterwards, no loss of bowel and bladder, no saddle numbness Relieved by:  OTC medications Worsened by:  Coughing, deep breathing and movement Ineffective treatments:  OTC medications Associated symptoms: no leg pain, no numbness, no paresthesias and no weakness     Past Medical History:  Diagnosis Date  . UTI (urinary tract infection)    H/O one   Past Surgical History:  Procedure Laterality Date  . APPENDECTOMY    . LAPAROSCOPIC APPENDECTOMY N/A 07/02/2016   Procedure: APPENDECTOMY LAPAROSCOPIC;  Surgeon: Clayburn Pert, MD;  Location: [facility] ORS;  Service: General;  Laterality: N/A;  . WISDOM TOOTH EXTRACTION     Family History  Problem Relation Age of Onset  . Alcohol abuse Father   . Alcohol abuse Maternal Aunt   . Cancer Paternal Aunt        breast  . Cancer Maternal Grandmother        breast  . Cancer Paternal Grandfather        lung   Social History  Substance Use Topics  . Smoking status: Current Every Day Smoker    Types: Cigarettes  . Smokeless tobacco: Current User    Types: Snuff  . Alcohol use 12.0 oz/week    10 Cans of beer, 10 Shots of liquor per week   OB History    Gravida Para Term Preterm AB Living   0 0 0 0 0 0   SAB TAB Ectopic Multiple Live Births   0 0 0 0       Review of Systems  Musculoskeletal: Positive for back pain and myalgias.   Neurological: Negative for weakness, numbness and paresthesias.  All other systems reviewed and are negative.   Allergies  Patient has no known allergies.  Home Medications   Prior to Admission medications   Medication Sig Start Date End Date Taking? Authorizing Provider  ibuprofen (ADVIL,MOTRIN) 600 MG tablet Take 1 tablet (600 mg total) by mouth every 8 (eight) hours as needed for fever or mild pain. 07/03/16  Yes Piscoya, Jose, MD  Lysine 500 MG TABS Take 6 tablets by mouth daily.   Yes [provider]  ranitidine (ZANTAC) 150 MG capsule Take 150 mg by mouth daily as needed for heartburn.   Yes [provider]  sertraline (ZOLOFT) 100 MG tablet Take 100 mg by mouth daily. 10/24/14  Yes [provider]  omeprazole (PRILOSEC) 20 MG capsule TAKE 1 CAPSULE(20 MG) BY MOUTH DAILY 08/18/16   Caryn Section, Linden Dolin, PA-C  orphenadrine (NORFLEX) 100 MG tablet Take 1 tablet (100 mg total) by mouth 2 (two) times daily. 01/05/92   Maisie Hauser, Jeanett Schlein, NP  pravastatin (PRAVACHOL) 20 MG tablet TAKE 1 TABLET(20 MG) BY MOUTH DAILY 08/18/16   Caryn Section Linden Dolin, PA-C   Meds Ordered and Administered this Visit  Medications - No data to display  BP 130/90 (BP Location: Left Arm)  Pulse 80   Temp 98.1 F (36.7 C) (Oral)   Resp 16   Ht 5\' 7"  (1.702 m)   Wt 200 lb (90.7 kg)   LMP 12/22/2016   SpO2 100%   BMI 31.32 kg/m  No data found.   Physical Exam  Constitutional: She is oriented to person, place, and time. Vital signs are normal. She appears well-developed and well-nourished. She is active and cooperative.  Non-toxic appearance. She does not have a sickly appearance. She does not appear ill. No distress.  Cardiovascular: Normal rate, intact distal pulses and normal pulses.   Pulses:      Dorsalis pedis pulses are 2+ on the right side, and 2+ on the left side.  Pulmonary/Chest: Effort normal.  Musculoskeletal:       Right shoulder: She exhibits tenderness, pain and spasm. She  exhibits normal range of motion, no bony tenderness, no swelling, no effusion, no crepitus, no deformity, no laceration, normal pulse and normal strength.       Lumbar back: She exhibits tenderness, pain and spasm. She exhibits normal range of motion, no bony tenderness, no swelling, no edema, no deformity, no laceration and normal pulse.  -SLE bilateral, dorsiflex/ext equal bilateral, MAEW, no foot drop, NV intact  Neurological: She is alert and oriented to person, place, and time. She has normal strength and normal reflexes. No cranial nerve deficit or sensory deficit. She displays a negative Romberg sign. GCS eye subscore is 4. GCS verbal subscore is 5. GCS motor subscore is 6.  Psychiatric: She has a normal mood and affect. Her speech is normal and behavior is normal.  Nursing note and vitals reviewed.   Urgent Care Course     Procedures (including critical care time)  Labs Review Labs Reviewed - No data to display  Imaging Review No results found.        MDM   1. Strain of lumbar region, initial encounter   2. Muscle spasm of back     Offered pt Toradol injection in office, pt declined. Discussed plan of  Care with pt: Rest,take meds as direceted. Avoid lifting,turning,twisting as it makes back pain worse. Mya use heat/ice 20 min 3 x daily. Return to UC as needed. Follow up with PCP if symptoms worsen. Pt verbalized understanding to this provider.     Tori Milks, NP 45/36/46 [phone]

## 2016-12-30 NOTE — ED Triage Notes (Signed)
Patient started having back spasms yesterday from excessive coughing. Coughing is due to vomiting once 2 days ago.

## 2016-12-30 NOTE — Discharge Instructions (Signed)
Rest,take meds as direceted. Avoid lifting,turning,twisting as it makes back pain worse. Mya use heat/ice 20 min 3 x daily. Return to UC as needed. Follow up with PCP if symptoms worsen.

## 2017-01-19 IMAGING — US US ABDOMEN COMPLETE
1 series · 14 of 25 positions shown · non-contrast
Comparison: None.

CLINICAL DATA: Chronic diarrhea elevated liver enzymes abdominal
pain for 1 month

EXAM:
ABDOMEN ULTRASOUND COMPLETE

[Series 1: us abdomen complete · 0.22mm/px · 14 of 78 slices shown]
[im 1/78]
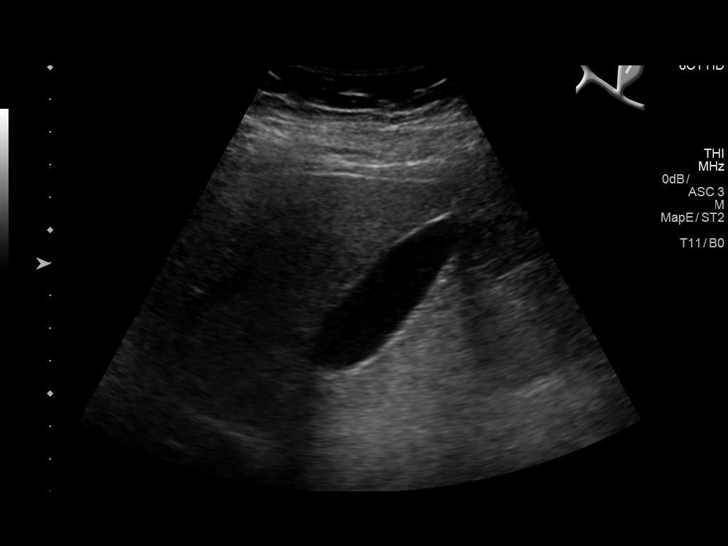
[im 7/78]
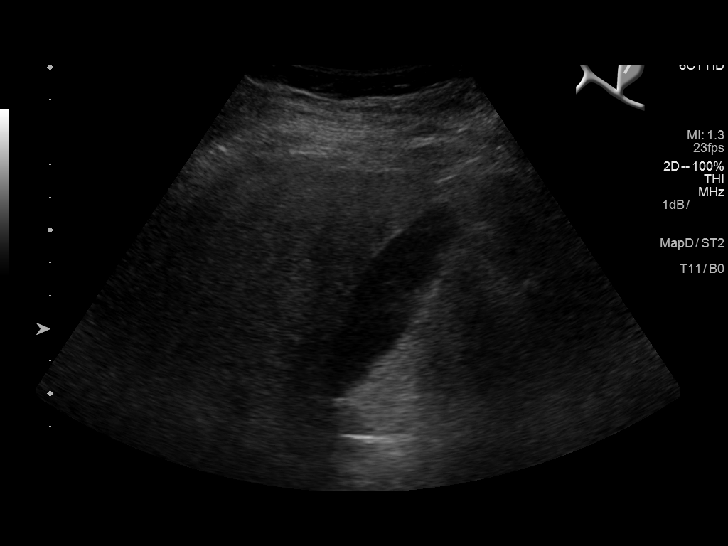
[im 13/78]
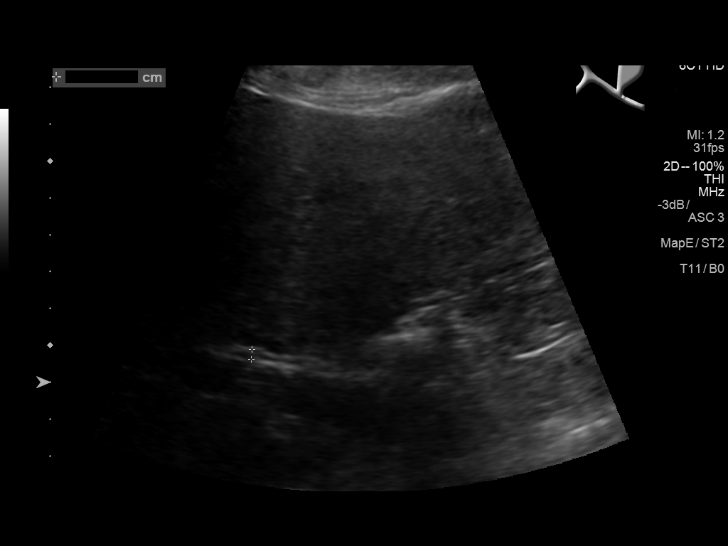
[im 20/78]
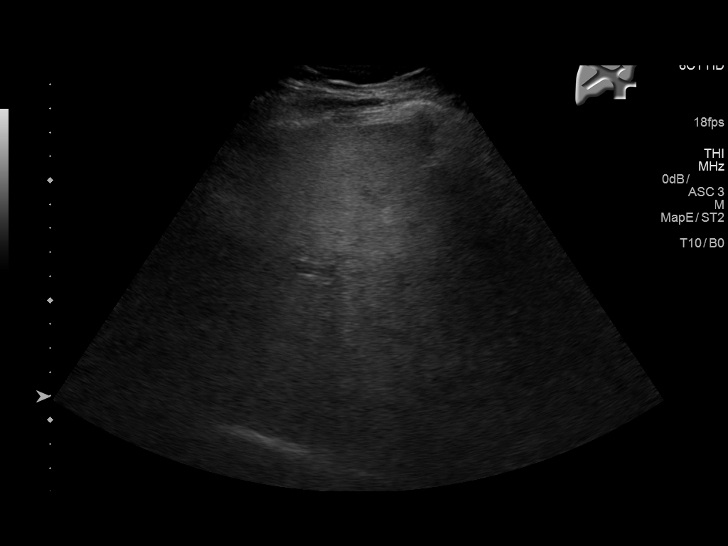
[im 26/78]
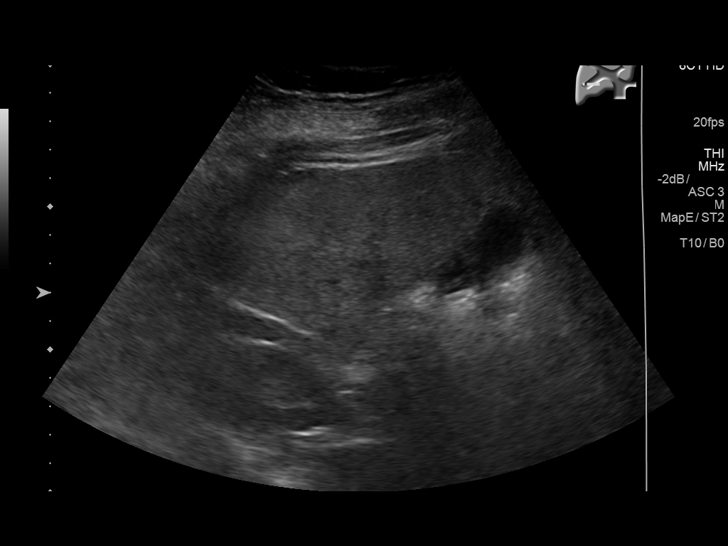
[im 29/78]
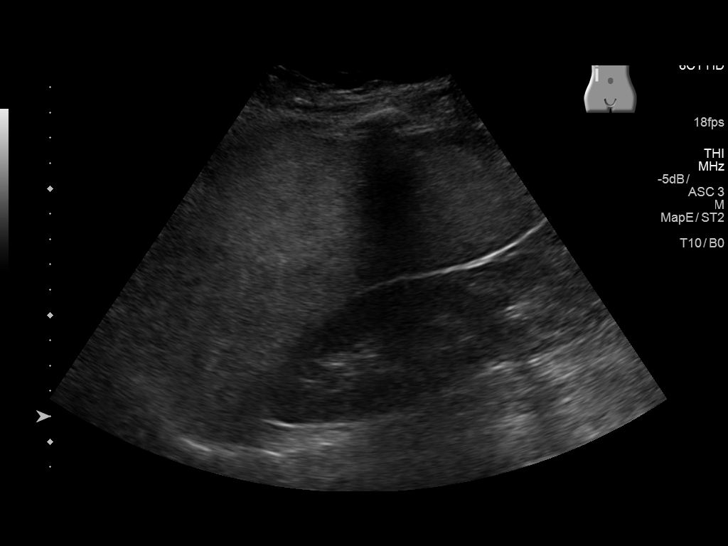
[im 36/78]
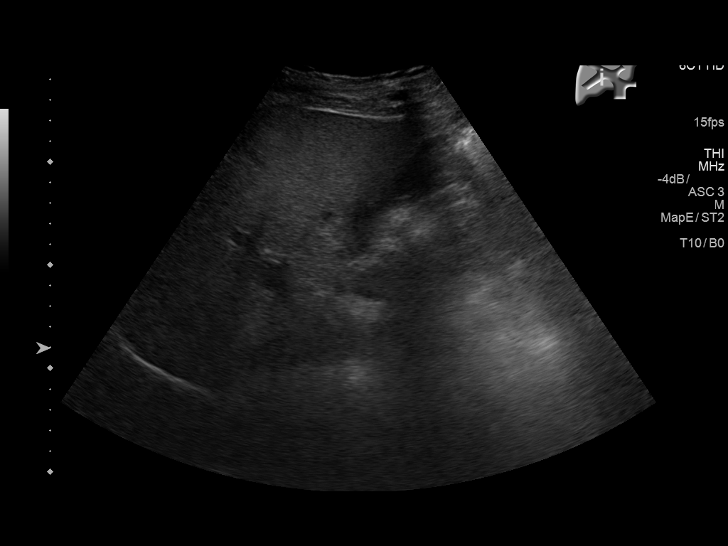
[im 42/78]
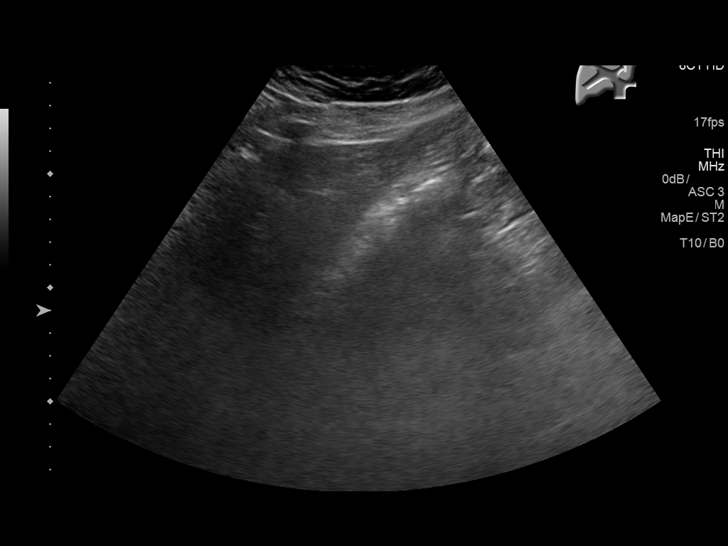
[im 49/78]
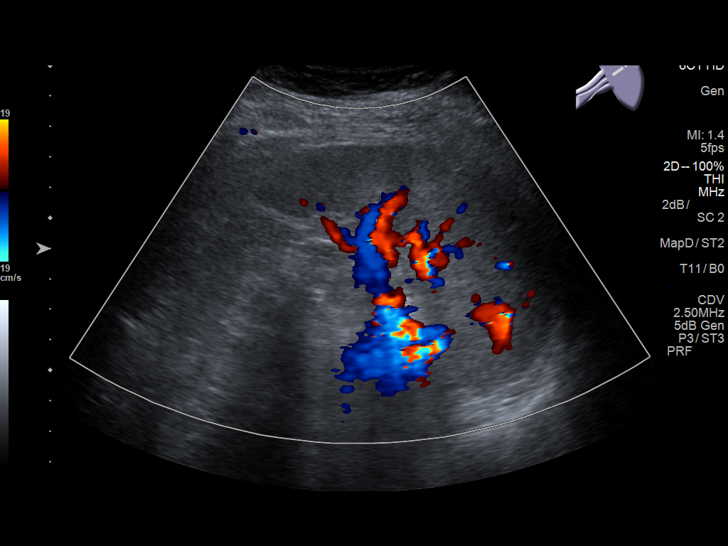
[im 52/78]
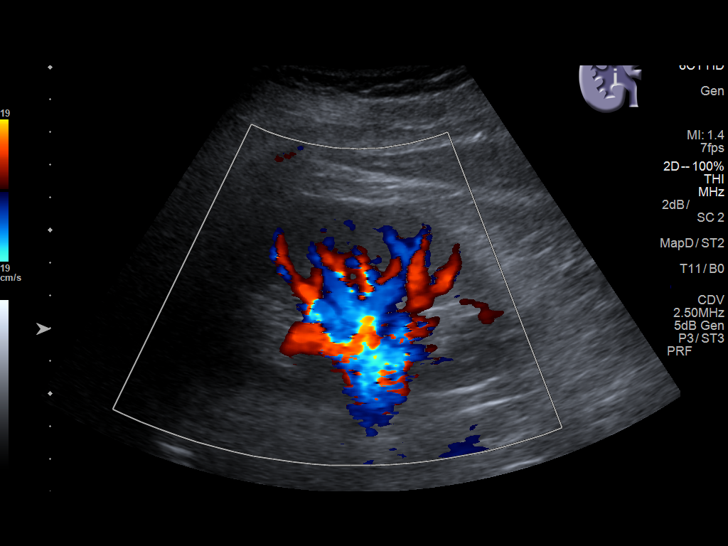
[im 58/78]
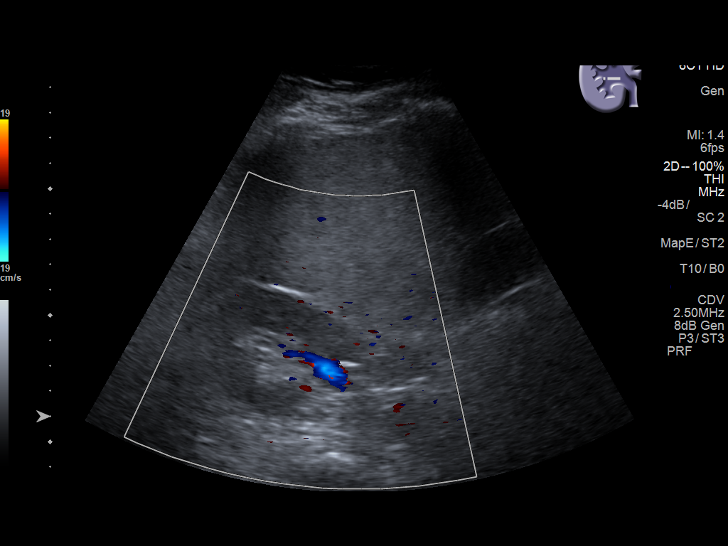
[im 65/78]
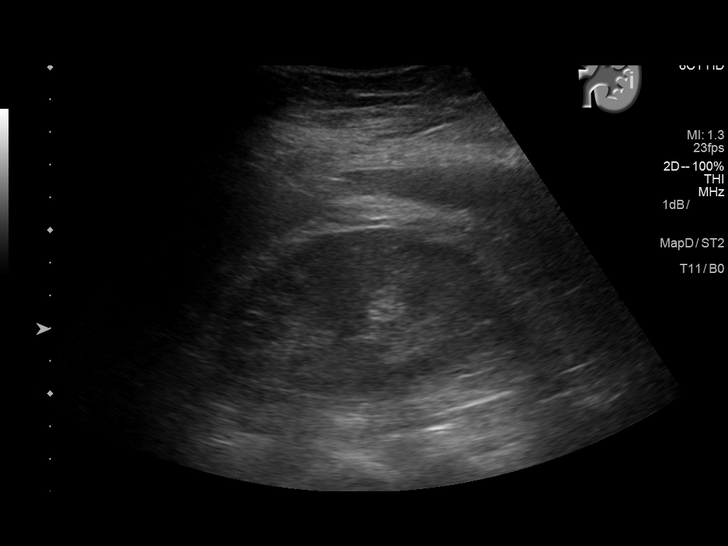
[im 71/78]
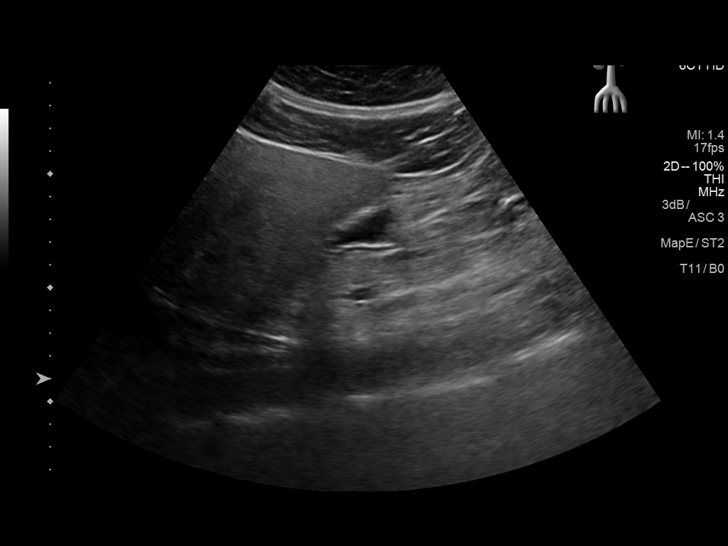
[im 78/78]
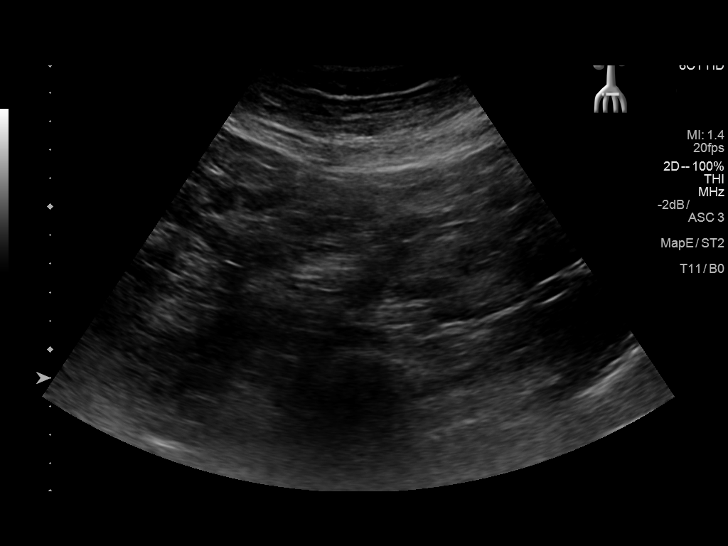

[14 of 25 positions shown; findings below may reference images not displayed]

FINDINGS: Gallbladder: No gallstones or wall thickening visualized. No
sonographic Murphy sign noted by sonographer.

Common bile duct: Diameter: 3 mm

Liver: Diffusely coarsened echotexture. No focal abnormalities
identified but sensitivity for potential focal abnormalities is
diminished.

IVC: No abnormality visualized.

Pancreas: Obscured by bowel gas

Spleen: Size and appearance within normal limits.

Right Kidney: Length: 10.4 cm. Echogenicity within normal limits. No
mass or hydronephrosis visualized.

Left Kidney: Length: 10.8 cm. Echogenicity within normal limits. No
mass or hydronephrosis visualized.

Abdominal aorta: Normal proximally but obscured by bowel gas
distally.

Other findings: None.
IMPRESSION: Study somewhat limited by bowel gas. Evidence of hepatic parenchymal
disease likely steatosis. No acute abnormalities.

## 2018-10-10 IMAGING — CT CT ABD-PELV W/ CM
2 of 4 series · 17 of 46 positions shown, 19 images · IV contrast (iopamidol)
Comparison: None.

CLINICAL DATA: Right mid and lower quadrant abdominal pain for 1
day. Slight fever.

EXAM:
CT ABDOMEN AND PELVIS WITH CONTRAST
TECHNIQUE: Multidetector CT imaging of the abdomen and pelvis was performed
using the standard protocol following bolus administration of
intravenous contrast.
CONTRAST:  100mL TIT5D3-DII IOPAMIDOL (TIT5D3-DII) INJECTION 61%

[Series 2: axial soft tissue · axial · 0.68mm/px · z∈[-931,-506]mm · 14 of 95 slices shown, 16 images]
[im 5/95  soft-tissue]
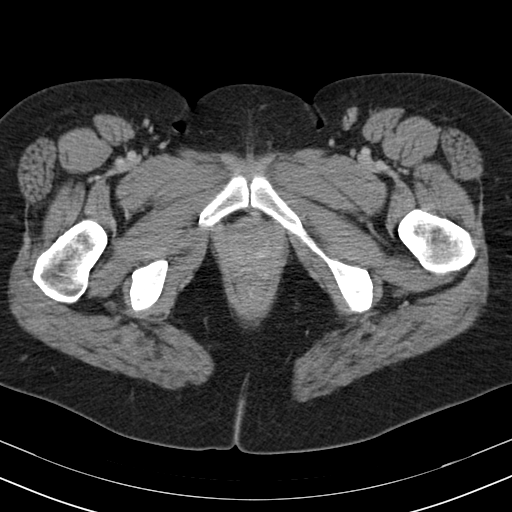
[im 5/95  bone]
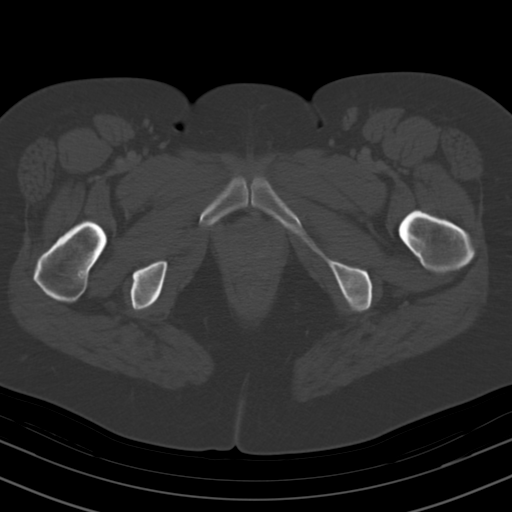
[im 13/95  soft-tissue]
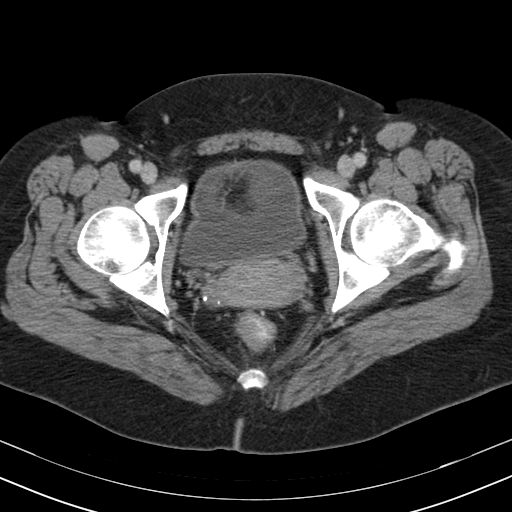
[im 17/95  soft-tissue]
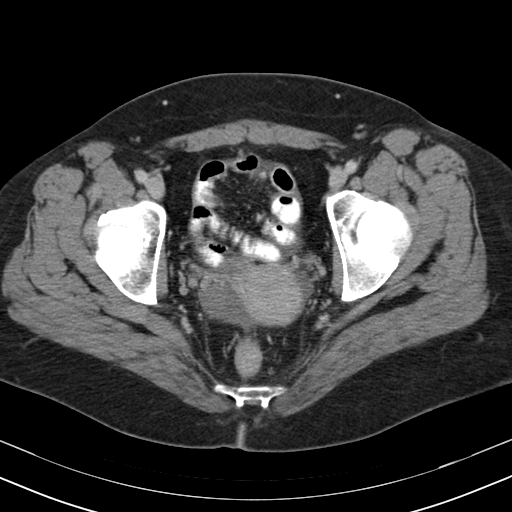
[im 25/95  soft-tissue]
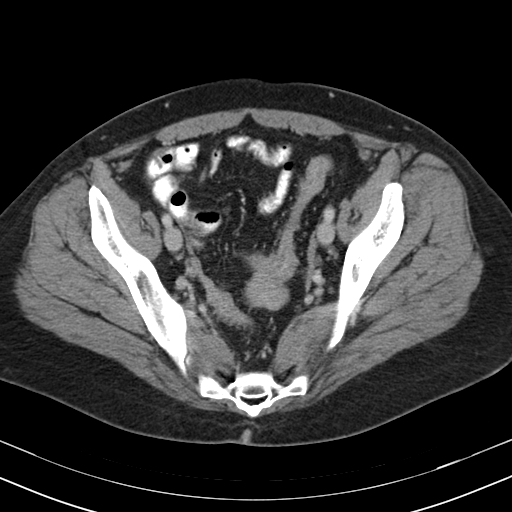
[im 33/95  soft-tissue]
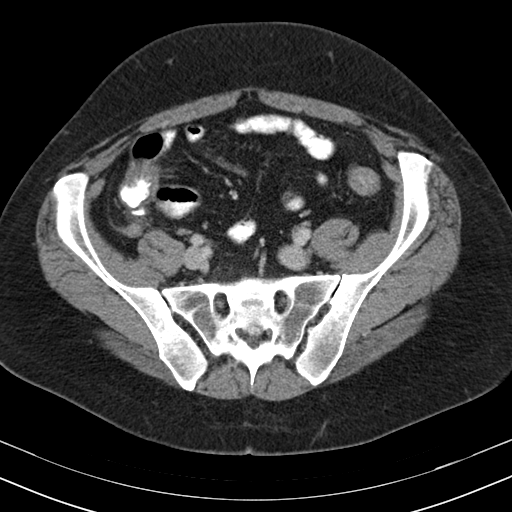
[im 37/95  soft-tissue]
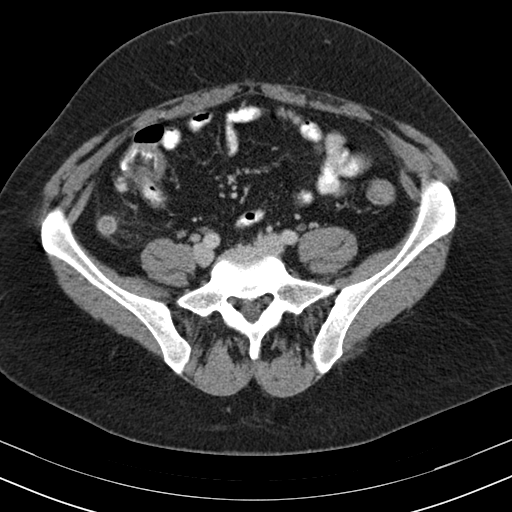
[im 45/95  soft-tissue]
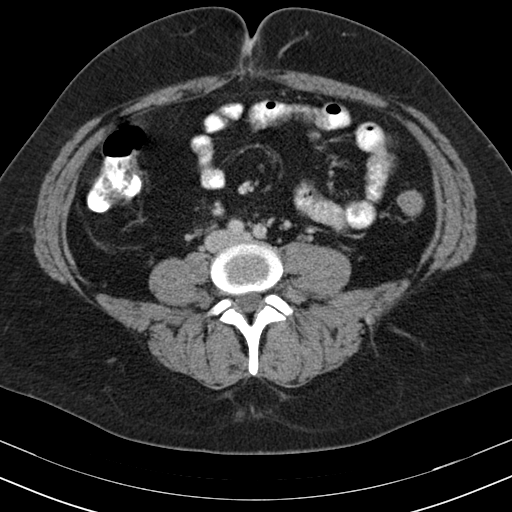
[im 50/95  soft-tissue]
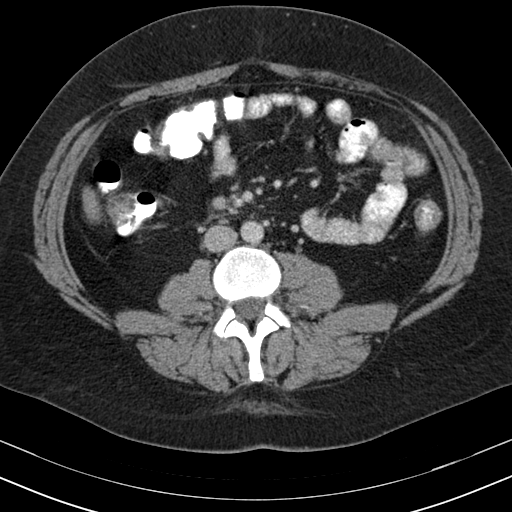
[im 58/95  soft-tissue]
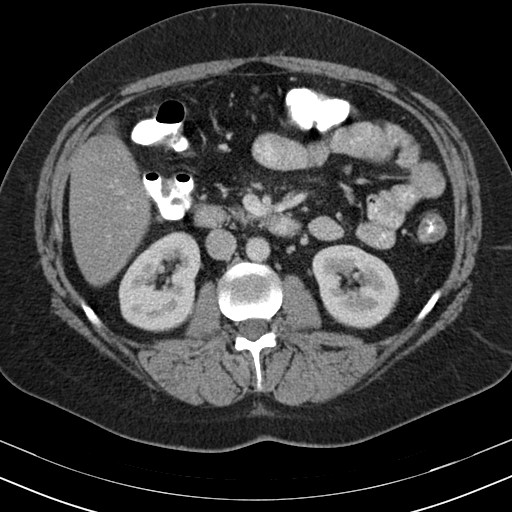
[im 58/95  bone]
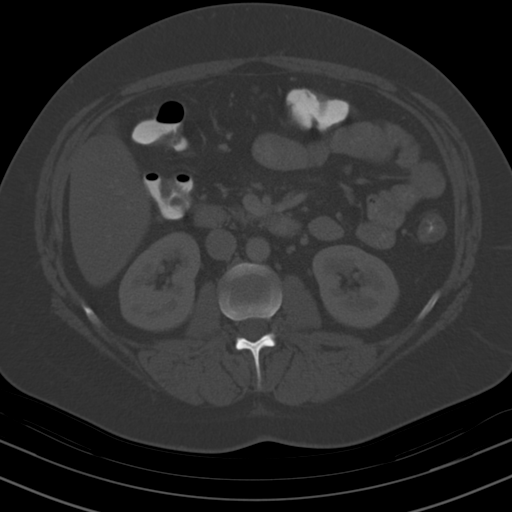
[im 62/95  soft-tissue]
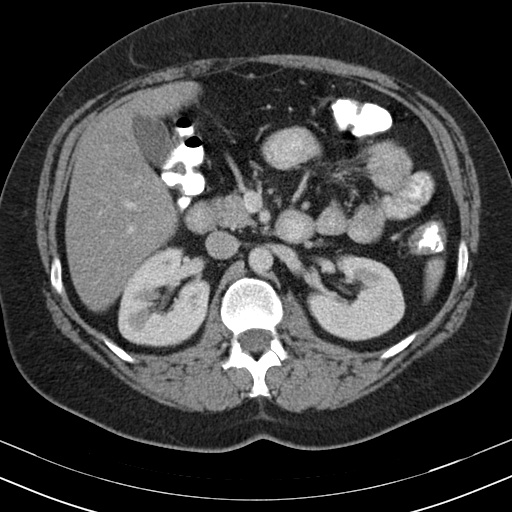
[im 70/95  soft-tissue]
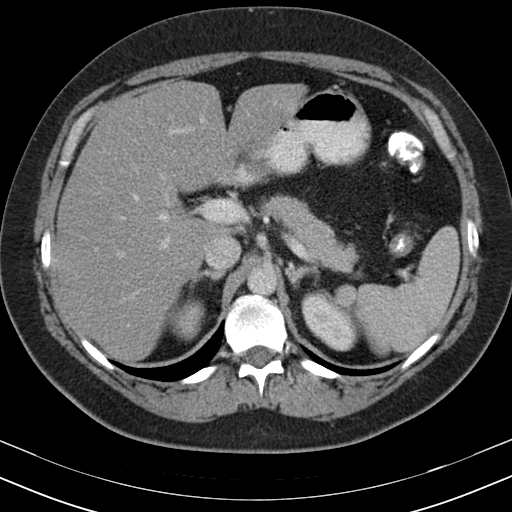
[im 78/95  soft-tissue]
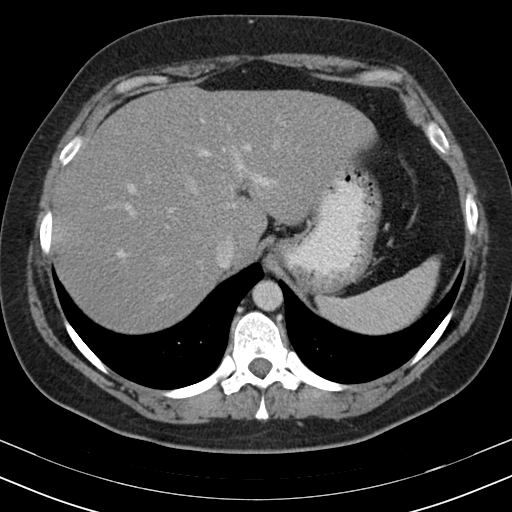
[im 82/95  soft-tissue]
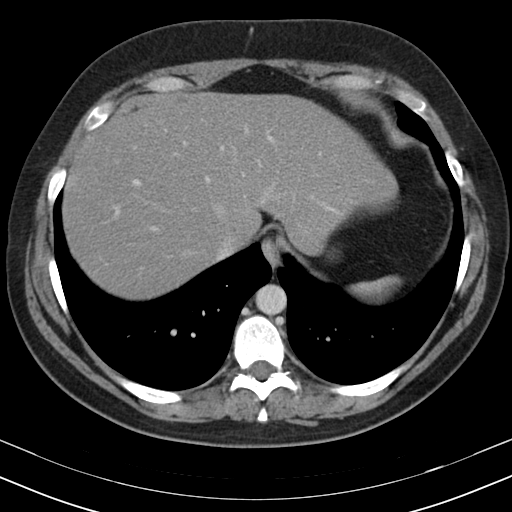
[im 90/95  soft-tissue]
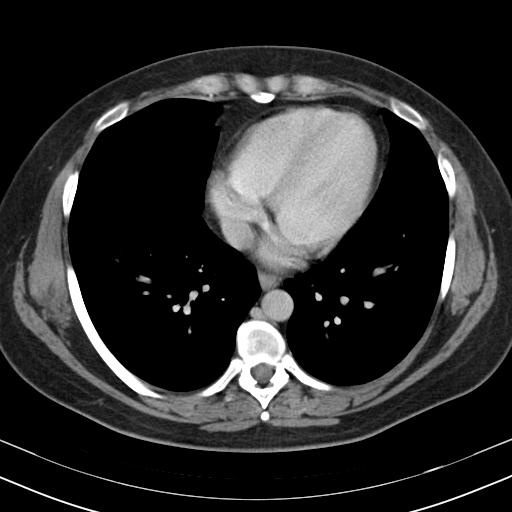

[Series 602: coronal · coronal · 0.92mm/px · 3 of 119 slices shown]
[im 40/119  soft-tissue]
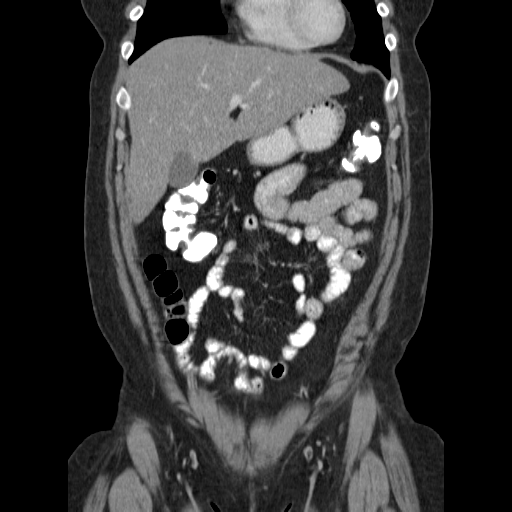
[im 53/119  soft-tissue]
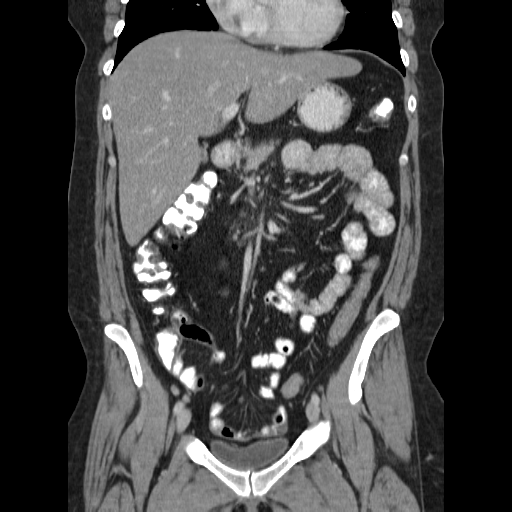
[im 66/119  soft-tissue]
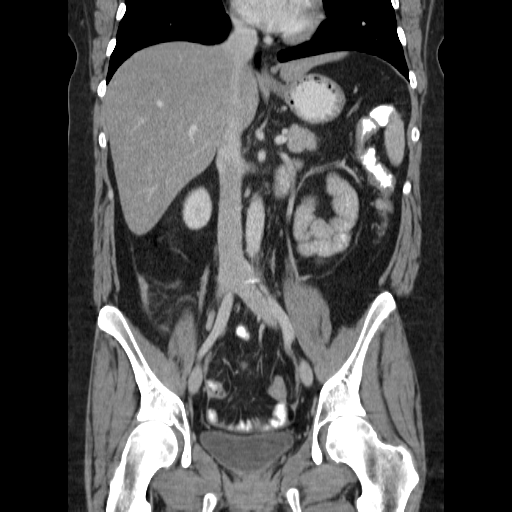

[17 of 46 positions shown; findings below may reference images not displayed]

FINDINGS: Lower chest:  No contributory findings.

Hepatobiliary: Possible hepatic steatosis, but certainty limited by
contrast timing. No focal liver abnormality.No evidence of biliary
obstruction or stone.

Pancreas: Unremarkable.

Spleen: Unremarkable.

Adrenals/Urinary Tract: Negative adrenals. No hydronephrosis or
stone. Unremarkable bladder.

Stomach/Bowel: The appendix is distended to 13 mm outer wall
diameter with avidly enhancing serosa and mild mesoappendix
inflammation. No perforation or abscess. Trace pelvic fluid. The
appendix extends posterior to the ascending colon.

Vascular/Lymphatic: No acute vascular abnormality. No mass or
adenopathy.

Reproductive:Exophytic mass from the uterine fundus consistent with
a fibroid, 27 mm in diameter. The mass is distinct from the
normal-appearing ovaries.

Other: No ascites or pneumoperitoneum.

Musculoskeletal: No acute abnormalities.

These results will be called to the ordering clinician or
representative by the Radiologist Assistant, and communication
documented in the PACS or zVision Dashboard.
IMPRESSION: 1. Acute, non-perforated appendicitis.
2. 27 mm subserosal uterine fibroid.

## 2019-06-28 ENCOUNTER — Other Ambulatory Visit: Payer: Self-pay

## 2019-06-28 ENCOUNTER — Ambulatory Visit
Admission: EM | Admit: 2019-06-28 | Discharge: 2019-06-28 | Disposition: A | Discharge status: Home / Self Care | Payer: HRSA Program | Attending: Family Medicine | Admitting: Family Medicine

## 2019-06-28 DIAGNOSIS — Z20828 Contact with and (suspected) exposure to other viral communicable diseases: Secondary | ICD-10-CM | POA: Insufficient documentation

## 2019-06-28 DIAGNOSIS — Z20822 Contact with and (suspected) exposure to covid-19: Secondary | ICD-10-CM

## 2019-06-28 NOTE — Discharge Instructions (Signed)
Results available in 24 to 48 hours.  Stay home.  Take care  Dr. Lacinda Axon

## 2019-06-28 NOTE — ED Triage Notes (Signed)
Pt presents with c/o POS COVID exposure to a family member's friend on 06/24/19. She denies any current symptoms. She does have chronic nasal drip when the weather changes.

## 2019-06-29 LAB — NOVEL CORONAVIRUS, NAA (HOSP ORDER, SEND-OUT TO REF LAB; TAT 18-24 HRS): SARS-CoV-2, NAA: NOT DETECTED

## 2020-01-05 ENCOUNTER — Other Ambulatory Visit: Payer: Self-pay

## 2020-01-05 ENCOUNTER — Ambulatory Visit
Admission: EM | Admit: 2020-01-05 | Discharge: 2020-01-05 | Disposition: A | Discharge status: Home / Self Care | Payer: HRSA Program | Attending: Internal Medicine | Admitting: Internal Medicine

## 2020-01-05 DIAGNOSIS — U071 COVID-19: Secondary | ICD-10-CM

## 2020-01-05 MED ORDER — BENZONATATE 100 MG PO CAPS: 100.0000 mg | ORAL_CAPSULE | Freq: Three times a day (TID) | ORAL | 0 refills | Status: DC

## 2020-01-05 NOTE — ED Triage Notes (Signed)
Patient states that she has been having a cough with sinus pressure since Monday. States that she was tested for Covid this morning and is positive. Patient states that she is concerned her cough is not improving.

## 2020-01-06 NOTE — ED Provider Notes (Addendum)
[facility]    CSN: [id number] Arrival date & time: 01/05/20  1147      History   Chief Complaint Chief Complaint  Patient presents with  . covid positive    HPI Stacey Moreno is a 39 y.o. female to the urgent care to be evaluated for cough and sinus pressure which started on Monday.  Patient tested positive for COVID-19 infection this morning.  She denies any fever or chills.  Cough is not productive.  No shortness of breath, chest pain or chest pressure.  No dizziness, near syncope or syncopal episode.  No loss of taste or smell.  No nausea, vomiting or diarrhea.  Patient is not immunized against COVID-19 virus.  HPI  Past Medical History:  Diagnosis Date  . UTI (urinary tract infection)    H/O one    Patient Active Problem List   Diagnosis Date Noted  . H/O appendicitis 07/23/2016  . S/P laparoscopic appendectomy 07/23/2016  . PTSD (post-traumatic stress disorder) 05/26/2016  . Alcoholism and drug addiction in family 05/26/2016  . Acute alcoholic hepatitis 18/56/3149  . 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 Hypertriglyceridemia 05/26/2016  . Smoker unmotivated to quit 05/26/2016  . Smokeless tobacco use 05/26/2016  . Obesity, Class I, BMI 30.0-34.9 (see actual BMI) 05/26/2016  . Alcohol use disorder, severe, dependence ([place]) 05/15/2016  . Generalized anxiety disorder 10/30/2014  . Encounter to establish care 10/30/2014  . Encounter for routine gynecological examination 10/30/2014  . Routine general medical examination at a health care facility 10/30/2014    Past Surgical History:  Procedure Laterality Date  . APPENDECTOMY    . LAPAROSCOPIC APPENDECTOMY N/A 07/02/2016   Procedure: APPENDECTOMY LAPAROSCOPIC;  Surgeon: Clayburn Pert, MD;  Location: [facility] ORS;  Service: General;  Laterality: N/A;  . WISDOM TOOTH EXTRACTION      OB History    Gravida  0   Para  0   Term  0   Preterm  0   AB  0   Living  0     SAB  0    TAB  0   Ectopic  0   Multiple  0   Live Births               Home Medications    Prior to Admission medications   Medication Sig Start Date End Date Taking? Authorizing Provider  ibuprofen (ADVIL,MOTRIN) 600 MG tablet Take 1 tablet (600 mg total) by mouth every 8 (eight) hours as needed for fever or mild pain. 07/03/16  Yes Piscoya, Jose, MD  Lysine 500 MG TABS Take 6 tablets by mouth daily.   Yes [provider]  omeprazole (PRILOSEC) 20 MG capsule TAKE 1 CAPSULE(20 MG) BY MOUTH DAILY 08/18/16  Yes Fisher, Linden Dolin, PA-C  sertraline (ZOLOFT) 100 MG tablet Take 100 mg by mouth daily. 10/24/14  Yes [provider]  benzonatate (TESSALON) 100 MG capsule Take 1 capsule (100 mg total) by mouth every 8 (eight) hours. 01/05/20   Chase Picket, MD  pravastatin (PRAVACHOL) 20 MG tablet TAKE 1 TABLET(20 MG) BY MOUTH DAILY 08/18/16 01/05/20  Fisher, Linden Dolin, PA-C  ranitidine (ZANTAC) 150 MG capsule Take 150 mg by mouth daily as needed for heartburn.  01/05/20  [provider]    Family History Family History  Problem Relation Age of Onset  . Alcohol abuse Father   . Alcohol abuse Maternal Aunt   . Cancer Paternal Aunt  breast  . Cancer Maternal Grandmother        breast  . Cancer Paternal Grandfather        lung    Social History Social History   Tobacco Use  . Smoking status: Current Every Day Smoker    Packs/day: 0.25    Types: Cigarettes  . Smokeless tobacco: Current User    Types: Snuff  Vaping Use  . Vaping Use: Never used  Substance Use Topics  . Alcohol use: Not Currently    Alcohol/week: 20.0 standard drinks    Types: 10 Cans of beer, 10 Shots of liquor per week  . Drug use: No     Allergies   Patient has no known allergies.   Review of Systems Review of Systems  HENT: Positive for congestion and sinus pressure. Negative for ear discharge, ear pain, sinus pain and sore throat.   Eyes: Negative.   Gastrointestinal: Negative for  abdominal pain, nausea and vomiting.  Genitourinary: Negative.   Musculoskeletal: Positive for arthralgias.  Skin: Negative.   Neurological: Negative for dizziness and numbness.     Physical Exam Triage Vital Signs ED Triage Vitals  Enc Vitals Group     BP 01/05/20 1213 (!) 126/93     Pulse Rate 01/05/20 1213 68     Resp 01/05/20 1213 18     Temp 01/05/20 1213 98.4 F (36.9 C)     Temp Source 01/05/20 1213 Oral     SpO2 01/05/20 1213 100 %     Weight 01/05/20 1210 200 lb (90.7 kg)     Height 01/05/20 1210 5\' 7"  (1.702 m)     Head Circumference --      Peak Flow --      Pain Score 01/05/20 1210 6     Pain Loc --      Pain Edu? --      Excl. in [place]? --    No data found.  Updated Vital Signs BP (!) 126/93 (BP Location: Left Arm)   Pulse 68   Temp 98.4 F (36.9 C) (Oral)   Resp 18   Ht 5\' 7"  (1.702 m)   Wt 90.7 kg   LMP 01/01/2020   SpO2 100%   BMI 31.32 kg/m   Visual Acuity Right Eye Distance:   Left Eye Distance:   Bilateral Distance:    Right Eye Near:   Left Eye Near:    Bilateral Near:     Physical Exam Constitutional:      General: She is in acute distress.     Appearance: She is ill-appearing.  Cardiovascular:     Rate and Rhythm: Normal rate and regular rhythm.     Pulses: Normal pulses.     Heart sounds: Normal heart sounds.  Pulmonary:     Effort: Pulmonary effort is normal. No respiratory distress.     Breath sounds: Normal breath sounds. No rhonchi or rales.  Abdominal:     General: Bowel sounds are normal.     Palpations: Abdomen is soft.  Skin:    Capillary Refill: Capillary refill takes less than 2 seconds.  Neurological:     Mental Status: She is alert.      UC Treatments / Results  Labs (all labs ordered are listed, but only abnormal results are displayed) Labs Reviewed - No data to display  EKG   Radiology No results found.  Procedures Procedures (including critical care time)  Medications Ordered in UC Medications  - No data to  display  Initial Impression / Assessment and Plan / UC Course  I have reviewed the triage vital signs and the nursing notes.  Pertinent labs & imaging results that were available during my care of the patient were reviewed by me and considered in my medical decision making (see chart for details).     1.  COVID-19 infection with persistent cough: Patient is encouraged to optimize oral fluid intake Tylenol/Motrin as needed for pain/fever Tessalon Perles 100 mg every 8 hours as needed for cough Patient is advised to isolate with family for 10 days from symptom onset. Isolation ends on 7/14. Patient is encouraged to engage the urgent care via virtual visit if she continues to have concerns. Final Clinical Impressions(s) / UC Diagnoses   Final diagnoses:  COVID-19 virus infection   Discharge Instructions   None    ED Prescriptions    Medication Sig Dispense Auth. Provider   benzonatate (TESSALON) 100 MG capsule Take 1 capsule (100 mg total) by mouth every 8 (eight) hours. 30 capsule Sandra Tellefsen, Myrene Galas, MD     PDMP not reviewed this encounter.   Chase Picket, MD 01/06/20 1130    Chase Picket, MD 01/06/20 1130

## 2020-01-08 ENCOUNTER — Telehealth: Payer: Self-pay | Admitting: Emergency Medicine

## 2020-01-08 MED ORDER — ONDANSETRON 4 MG PO TBDP: 4.0000 mg | ORAL_TABLET | Freq: Three times a day (TID) | ORAL | 0 refills | Status: AC | PRN

## 2020-01-08 NOTE — Telephone Encounter (Signed)
Patient called in today c/o nausea. Patient tested positive for covid on 01/05/20. Spoke with Dr. Lanny Cramp who advised to send in Zofran ODT 4 mg 1 every 6-8 hours prn nausea #30 no refills. Prescription sent to [facility] per patient's request.

## 2020-10-19 ENCOUNTER — Encounter: Payer: Self-pay | Admitting: Emergency Medicine

## 2020-10-19 ENCOUNTER — Ambulatory Visit
Admission: EM | Admit: 2020-10-19 | Discharge: 2020-10-19 | Disposition: A | Discharge status: Home / Self Care | Payer: BC Managed Care – PPO | Attending: Emergency Medicine | Admitting: Emergency Medicine

## 2020-10-19 ENCOUNTER — Other Ambulatory Visit: Payer: Self-pay

## 2020-10-19 DIAGNOSIS — R059 Cough, unspecified: Secondary | ICD-10-CM | POA: Diagnosis not present

## 2020-10-19 DIAGNOSIS — Z20822 Contact with and (suspected) exposure to covid-19: Secondary | ICD-10-CM | POA: Insufficient documentation

## 2020-10-19 DIAGNOSIS — Z79899 Other long term (current) drug therapy: Secondary | ICD-10-CM | POA: Diagnosis not present

## 2020-10-19 DIAGNOSIS — J069 Acute upper respiratory infection, unspecified: Secondary | ICD-10-CM

## 2020-10-19 DIAGNOSIS — F17210 Nicotine dependence, cigarettes, uncomplicated: Secondary | ICD-10-CM | POA: Diagnosis not present

## 2020-10-19 MED ORDER — PROMETHAZINE-DM 6.25-15 MG/5ML PO SYRP: 5.0000 mL | ORAL_SOLUTION | Freq: Four times a day (QID) | ORAL | 0 refills | Status: AC | PRN

## 2020-10-19 MED ORDER — IPRATROPIUM BROMIDE 0.06 % NA SOLN: 2.0000 | Freq: Four times a day (QID) | NASAL | 12 refills | Status: AC

## 2020-10-19 MED ORDER — BENZONATATE 100 MG PO CAPS: 200.0000 mg | ORAL_CAPSULE | Freq: Three times a day (TID) | ORAL | 0 refills | Status: AC

## 2020-10-19 NOTE — ED Provider Notes (Signed)
[facility]    CSN: [id number] Arrival date & time: 10/19/20  1423      History   Chief Complaint Chief Complaint  Patient presents with  . Cough    HPI Stacey Moreno is a 40 y.o. female.   HPI   40 year old female here for evaluation of nasal congestion and cough.  Patient reports that she has been experience a stuffy nose and nonproductive cough since yesterday.  Both of her room mates have bronchitis currently and she went to get evaluated before her symptoms worsened.  Patient denies fever, runny nose, ear pain or pressure, sore throat, shortness of breath, or wheezing.  Past Medical History:  Diagnosis Date  . UTI (urinary tract infection)    H/O one    Patient Active Problem List   Diagnosis Date Noted  . H/O appendicitis 07/23/2016  . S/P laparoscopic appendectomy 07/23/2016  . PTSD (post-traumatic stress disorder) 05/26/2016  . Alcoholism and drug addiction in family 05/26/2016  . Acute alcoholic hepatitis 62/83/1517  . 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 Hypertriglyceridemia 05/26/2016  . Smoker unmotivated to quit 05/26/2016  . Smokeless tobacco use 05/26/2016  . Obesity, Class I, BMI 30.0-34.9 (see actual BMI) 05/26/2016  . Alcohol use disorder, severe, dependence ([place]) 05/15/2016  . Generalized anxiety disorder 10/30/2014  . Encounter to establish care 10/30/2014  . Encounter for routine gynecological examination 10/30/2014  . Routine general medical examination at a health care facility 10/30/2014    Past Surgical History:  Procedure Laterality Date  . APPENDECTOMY    . LAPAROSCOPIC APPENDECTOMY N/A 07/02/2016   Procedure: APPENDECTOMY LAPAROSCOPIC;  Surgeon: Clayburn Pert, MD;  Location: [facility] ORS;  Service: General;  Laterality: N/A;  . WISDOM TOOTH EXTRACTION      OB History    Gravida  0   Para  0   Term  0   Preterm  0   AB  0   Living  0     SAB  0   IAB  0   Ectopic  0   Multiple  0    Live Births               Home Medications    Prior to Admission medications   Medication Sig Start Date End Date Taking? Authorizing Provider  benzonatate (TESSALON) 100 MG capsule Take 2 capsules (200 mg total) by mouth every 8 (eight) hours. 10/19/20  Yes Margarette Canada, NP  ipratropium (ATROVENT) 0.06 % nasal spray Place 2 sprays into both nostrils 4 (four) times daily. 10/19/20  Yes Margarette Canada, NP  omeprazole (PRILOSEC) 20 MG capsule TAKE 1 CAPSULE(20 MG) BY MOUTH DAILY 08/18/16  Yes Caryn Section Linden Dolin, PA-C  promethazine-dextromethorphan (PROMETHAZINE-DM) 6.25-15 MG/5ML syrup Take 5 mLs by mouth 4 (four) times daily as needed. 10/19/20  Yes Margarette Canada, NP  sertraline (ZOLOFT) 100 MG tablet Take 100 mg by mouth daily. 10/24/14  Yes [provider]  ibuprofen (ADVIL,MOTRIN) 600 MG tablet Take 1 tablet (600 mg total) by mouth every 8 (eight) hours as needed for fever or mild pain. 07/03/16   Piscoya, Jacqulyn Bath, MD  Lysine 500 MG TABS Take 6 tablets by mouth daily.    [provider]  ondansetron (ZOFRAN ODT) 4 MG disintegrating tablet Take 1 tablet (4 mg total) by mouth every 8 (eight) hours as needed for nausea or vomiting. 01/08/20   Lamptey, Myrene Galas, MD  pravastatin (PRAVACHOL) 20 MG tablet TAKE 1 TABLET(20 MG) BY MOUTH DAILY  08/18/16 01/05/20  Fisher, Linden Dolin, PA-C  ranitidine (ZANTAC) 150 MG capsule Take 150 mg by mouth daily as needed for heartburn.  01/05/20  [provider]    Family History Family History  Problem Relation Age of Onset  . Alcohol abuse Father   . Alcohol abuse Maternal Aunt   . Cancer Paternal Aunt        breast  . Cancer Maternal Grandmother        breast  . Cancer Paternal Grandfather        lung    Social History Social History   Tobacco Use  . Smoking status: Current Every Day Smoker    Packs/day: 0.25    Types: Cigarettes  . Smokeless tobacco: Current User    Types: Snuff  Vaping Use  . Vaping Use: Never used  Substance Use  Topics  . Alcohol use: Not Currently    Alcohol/week: 20.0 standard drinks    Types: 10 Cans of beer, 10 Shots of liquor per week  . Drug use: No     Allergies   Patient has no known allergies.   Review of Systems Review of Systems  Constitutional: Negative for activity change, appetite change and fever.  HENT: Positive for congestion. Negative for ear pain, rhinorrhea and sore throat.   Respiratory: Positive for cough. Negative for shortness of breath and wheezing.   Musculoskeletal: Negative for arthralgias and myalgias.  Skin: Negative for rash.  Hematological: Negative.   Psychiatric/Behavioral: Negative.      Physical Exam Triage Vital Signs ED Triage Vitals  Enc Vitals Group     BP 10/19/20 1456 (!) 143/101     Pulse Rate 10/19/20 1456 82     Resp 10/19/20 1456 14     Temp 10/19/20 1456 98 F (36.7 C)     Temp Source 10/19/20 1456 Oral     SpO2 10/19/20 1456 100 %     Weight 10/19/20 1452 200 lb (90.7 kg)     Height 10/19/20 1452 5\' 7"  (1.702 m)     Head Circumference --      Peak Flow --      Pain Score 10/19/20 1452 0     Pain Loc --      Pain Edu? --      Excl. in [place]? --    No data found.  Updated Vital Signs BP (!) 143/101 (BP Location: Left Arm)   Pulse 82   Temp 98 F (36.7 C) (Oral)   Resp 14   Ht 5\' 7"  (1.702 m)   Wt 200 lb (90.7 kg)   LMP 09/28/2020   SpO2 100%   BMI 31.32 kg/m   Visual Acuity Right Eye Distance:   Left Eye Distance:   Bilateral Distance:    Right Eye Near:   Left Eye Near:    Bilateral Near:     Physical Exam Vitals and nursing note reviewed.  Constitutional:      General: She is not in acute distress.    Appearance: Normal appearance. She is normal weight. She is not ill-appearing.  HENT:     Head: Normocephalic and atraumatic.     Right Ear: Tympanic membrane, ear canal and external ear normal. There is no impacted cerumen.     Left Ear: Tympanic membrane, ear canal and external ear normal. There is no  impacted cerumen.     Nose: Congestion and rhinorrhea present.     Mouth/Throat:     Mouth: Mucous membranes  are moist.     Pharynx: Oropharynx is clear. Posterior oropharyngeal erythema present.  Cardiovascular:     Rate and Rhythm: Normal rate and regular rhythm.     Pulses: Normal pulses.     Heart sounds: Normal heart sounds. No murmur heard. No gallop.   Pulmonary:     Effort: Pulmonary effort is normal.     Breath sounds: No wheezing, rhonchi or rales.  Musculoskeletal:     Cervical back: Normal range of motion and neck supple.  Lymphadenopathy:     Cervical: No cervical adenopathy.  Skin:    General: Skin is warm and dry.     Capillary Refill: Capillary refill takes less than 2 seconds.     Findings: No erythema or rash.  Neurological:     General: No focal deficit present.     Mental Status: She is alert and oriented to person, place, and time.  Psychiatric:        Mood and Affect: Mood normal.        Behavior: Behavior normal.        Thought Content: Thought content normal.        Judgment: Judgment normal.      UC Treatments / Results  Labs (all labs ordered are listed, but only abnormal results are displayed) Labs Reviewed  SARS CORONAVIRUS 2 (TAT 6-24 HRS)    EKG   Radiology No results found.  Procedures Procedures (including critical care time)  Medications Ordered in UC Medications - No data to display  Initial Impression / Assessment and Plan / UC Course  I have reviewed the triage vital signs and the nursing notes.  Pertinent labs & imaging results that were available during my care of the patient were reviewed by me and considered in my medical decision making (see chart for details).   Patient is a very pleasant 40 year old female here for evaluation of nasal congestion and cough is been going on since yesterday.  She is afebrile and has not had a runny nose, ear pain or pressure, sore throat, shortness breath, or wheezing.  Physical exam  reveals bilateral pearly gray tympanic membranes with a normal light reflex and clear external auditory canals.  Nasal mucosa is mildly erythematous and edematous with scant clear nasal discharge.  Posterior oropharynx has mild erythema with clear postnasal drip.  No cervical lymphadenopathy appreciated on exam.  Lungs are clear to auscultation.  Patient's exam is consistent with a viral URI with cough.  Will treat with ipratropium nasal spray, Tessalon Perles, and Promethazine DM cough syrup.   Final Clinical Impressions(s) / UC Diagnoses   Final diagnoses:  Viral URI with cough     Discharge Instructions     Use the Atrovent nasal spray, 2 squirts in each nostril every 6 hours, as needed for runny nose and postnasal drip.  Use the Tessalon Perles every 8 hours during the day.  Take them with a small sip of water.  They may give you some numbness to the base of your tongue or a metallic taste in your mouth, this is normal.  Use the Promethazine DM cough syrup at bedtime for cough and congestion.  It will make you drowsy so do not take it during the day.  Return for reevaluation or see your primary care provider for any new or worsening symptoms.     ED Prescriptions    Medication Sig Dispense Auth. Provider   benzonatate (TESSALON) 100 MG capsule Take 2 capsules (200 mg total)  by mouth every 8 (eight) hours. 21 capsule Margarette Canada, NP   ipratropium (ATROVENT) 0.06 % nasal spray Place 2 sprays into both nostrils 4 (four) times daily. 15 mL Margarette Canada, NP   promethazine-dextromethorphan (PROMETHAZINE-DM) 6.25-15 MG/5ML syrup Take 5 mLs by mouth 4 (four) times daily as needed. 118 mL Margarette Canada, NP     PDMP not reviewed this encounter.   Margarette Canada, NP 10/19/20 1555

## 2020-10-19 NOTE — ED Triage Notes (Signed)
Patient c/o stuffy nose and cough that started yesterday.  Patient denies fevers.

## 2020-10-19 NOTE — Discharge Instructions (Addendum)

## 2020-10-20 LAB — SARS CORONAVIRUS 2 (TAT 6-24 HRS): SARS Coronavirus 2: NEGATIVE

## 2020-11-15 DIAGNOSIS — N926 Irregular menstruation, unspecified: Secondary | ICD-10-CM | POA: Diagnosis not present

## 2020-11-15 DIAGNOSIS — F419 Anxiety disorder, unspecified: Secondary | ICD-10-CM | POA: Diagnosis not present

## 2020-11-15 DIAGNOSIS — F32A Depression, unspecified: Secondary | ICD-10-CM | POA: Diagnosis not present

## 2020-11-15 DIAGNOSIS — K219 Gastro-esophageal reflux disease without esophagitis: Secondary | ICD-10-CM | POA: Diagnosis not present

## 2021-03-05 DIAGNOSIS — Z6829 Body mass index (BMI) 29.0-29.9, adult: Secondary | ICD-10-CM | POA: Diagnosis not present

## 2021-03-05 DIAGNOSIS — G629 Polyneuropathy, unspecified: Secondary | ICD-10-CM | POA: Diagnosis not present

## 2021-03-08 DIAGNOSIS — R200 Anesthesia of skin: Secondary | ICD-10-CM | POA: Diagnosis not present

## 2021-03-12 DIAGNOSIS — R7989 Other specified abnormal findings of blood chemistry: Secondary | ICD-10-CM | POA: Diagnosis not present

## 2021-03-12 DIAGNOSIS — R200 Anesthesia of skin: Secondary | ICD-10-CM | POA: Diagnosis not present

## 2021-03-12 DIAGNOSIS — Z23 Encounter for immunization: Secondary | ICD-10-CM | POA: Diagnosis not present

## 2021-03-12 DIAGNOSIS — F1010 Alcohol abuse, uncomplicated: Secondary | ICD-10-CM | POA: Diagnosis not present

## 2021-03-12 DIAGNOSIS — Z6828 Body mass index (BMI) 28.0-28.9, adult: Secondary | ICD-10-CM | POA: Diagnosis not present

## 2021-03-25 DIAGNOSIS — R7989 Other specified abnormal findings of blood chemistry: Secondary | ICD-10-CM | POA: Diagnosis not present

## 2021-03-25 DIAGNOSIS — K760 Fatty (change of) liver, not elsewhere classified: Secondary | ICD-10-CM | POA: Diagnosis not present

## 2021-03-25 DIAGNOSIS — R160 Hepatomegaly, not elsewhere classified: Secondary | ICD-10-CM | POA: Diagnosis not present

## 2021-03-25 DIAGNOSIS — K7689 Other specified diseases of liver: Secondary | ICD-10-CM | POA: Diagnosis not present

## 2021-04-01 DIAGNOSIS — G621 Alcoholic polyneuropathy: Secondary | ICD-10-CM | POA: Diagnosis not present

## 2021-04-01 DIAGNOSIS — E873 Alkalosis: Secondary | ICD-10-CM | POA: Diagnosis not present

## 2021-04-01 DIAGNOSIS — F1290 Cannabis use, unspecified, uncomplicated: Secondary | ICD-10-CM | POA: Diagnosis not present

## 2021-04-01 DIAGNOSIS — R202 Paresthesia of skin: Secondary | ICD-10-CM | POA: Diagnosis not present

## 2021-04-01 DIAGNOSIS — Z20822 Contact with and (suspected) exposure to covid-19: Secondary | ICD-10-CM | POA: Diagnosis not present

## 2021-04-01 DIAGNOSIS — K7030 Alcoholic cirrhosis of liver without ascites: Secondary | ICD-10-CM | POA: Diagnosis not present

## 2021-04-01 DIAGNOSIS — E860 Dehydration: Secondary | ICD-10-CM | POA: Diagnosis not present

## 2021-04-01 DIAGNOSIS — F1010 Alcohol abuse, uncomplicated: Secondary | ICD-10-CM | POA: Diagnosis not present

## 2021-04-01 DIAGNOSIS — E8889 Other specified metabolic disorders: Secondary | ICD-10-CM | POA: Diagnosis not present

## 2021-04-01 DIAGNOSIS — R7401 Elevation of levels of liver transaminase levels: Secondary | ICD-10-CM | POA: Diagnosis not present

## 2021-04-01 DIAGNOSIS — G629 Polyneuropathy, unspecified: Secondary | ICD-10-CM | POA: Diagnosis not present

## 2021-04-01 DIAGNOSIS — I998 Other disorder of circulatory system: Secondary | ICD-10-CM | POA: Diagnosis not present

## 2021-04-01 DIAGNOSIS — Z6826 Body mass index (BMI) 26.0-26.9, adult: Secondary | ICD-10-CM | POA: Diagnosis not present

## 2021-04-01 DIAGNOSIS — F32A Depression, unspecified: Secondary | ICD-10-CM | POA: Diagnosis not present

## 2021-04-01 DIAGNOSIS — F17210 Nicotine dependence, cigarettes, uncomplicated: Secondary | ICD-10-CM | POA: Diagnosis not present

## 2021-04-01 DIAGNOSIS — I781 Nevus, non-neoplastic: Secondary | ICD-10-CM | POA: Diagnosis not present

## 2021-04-01 DIAGNOSIS — R200 Anesthesia of skin: Secondary | ICD-10-CM | POA: Diagnosis not present

## 2021-04-01 DIAGNOSIS — R64 Cachexia: Secondary | ICD-10-CM | POA: Diagnosis not present

## 2021-04-01 DIAGNOSIS — E8729 Other acidosis: Secondary | ICD-10-CM | POA: Diagnosis not present

## 2021-04-01 DIAGNOSIS — K219 Gastro-esophageal reflux disease without esophagitis: Secondary | ICD-10-CM | POA: Diagnosis not present

## 2021-04-01 DIAGNOSIS — R110 Nausea: Secondary | ICD-10-CM | POA: Diagnosis not present

## 2021-04-01 DIAGNOSIS — E8721 Acute metabolic acidosis: Secondary | ICD-10-CM | POA: Diagnosis not present

## 2021-04-01 DIAGNOSIS — R634 Abnormal weight loss: Secondary | ICD-10-CM | POA: Diagnosis not present

## 2021-04-01 DIAGNOSIS — G5603 Carpal tunnel syndrome, bilateral upper limbs: Secondary | ICD-10-CM | POA: Diagnosis not present

## 2021-04-01 DIAGNOSIS — E874 Mixed disorder of acid-base balance: Secondary | ICD-10-CM | POA: Diagnosis not present

## 2021-04-11 ENCOUNTER — Telehealth: Payer: Self-pay

## 2021-04-11 NOTE — Telephone Encounter (Signed)
Please put pt on waiting for Dr. Ronnald Ramp for cancellations.  KP

## 2021-04-11 NOTE — Telephone Encounter (Signed)
Copied from [place] [id number]. Topic: Appointment Scheduling - Scheduling Inquiry for Clinic >> Apr 11, 2021  3:13 PM Oneta Rack wrote: Any NPA cancellations with Dr. Ronnald Ramp please reach out. Patient is experiencing nerve pain and is not happy with current PCP, patient states she knows PCP personally

## 2021-04-12 DIAGNOSIS — R202 Paresthesia of skin: Secondary | ICD-10-CM | POA: Diagnosis not present

## 2021-04-16 DIAGNOSIS — F419 Anxiety disorder, unspecified: Secondary | ICD-10-CM | POA: Diagnosis not present

## 2021-04-16 DIAGNOSIS — M5030 Other cervical disc degeneration, unspecified cervical region: Secondary | ICD-10-CM | POA: Diagnosis not present

## 2021-04-16 DIAGNOSIS — R200 Anesthesia of skin: Secondary | ICD-10-CM | POA: Diagnosis not present

## 2021-04-16 DIAGNOSIS — F32A Depression, unspecified: Secondary | ICD-10-CM | POA: Diagnosis not present

## 2021-04-22 DIAGNOSIS — R200 Anesthesia of skin: Secondary | ICD-10-CM | POA: Diagnosis not present

## 2021-04-22 DIAGNOSIS — R208 Other disturbances of skin sensation: Secondary | ICD-10-CM | POA: Diagnosis not present

## 2021-04-22 DIAGNOSIS — R202 Paresthesia of skin: Secondary | ICD-10-CM | POA: Diagnosis not present

## 2021-04-22 DIAGNOSIS — G629 Polyneuropathy, unspecified: Secondary | ICD-10-CM | POA: Diagnosis not present

## 2021-05-01 DIAGNOSIS — R202 Paresthesia of skin: Secondary | ICD-10-CM | POA: Diagnosis not present

## 2021-05-01 DIAGNOSIS — R200 Anesthesia of skin: Secondary | ICD-10-CM | POA: Diagnosis not present

## 2021-05-02 ENCOUNTER — Other Ambulatory Visit: Payer: Self-pay

## 2021-05-02 ENCOUNTER — Ambulatory Visit: Payer: BC Managed Care – PPO | Attending: Neurology

## 2021-05-02 DIAGNOSIS — M6281 Muscle weakness (generalized): Secondary | ICD-10-CM | POA: Insufficient documentation

## 2021-05-02 DIAGNOSIS — R278 Other lack of coordination: Secondary | ICD-10-CM | POA: Insufficient documentation

## 2021-05-03 NOTE — Therapy (Signed)
[place] MAIN [facility] SERVICES [address] [place], [place], [address] Phone: [phone]   Fax:  [phone]  Occupational Therapy Evaluation  Patient Details  Name: GYANNA JAREMA MRN: [id number] Date of Birth: [birth year] Referring Provider (OT): Dr. Melrose Nakayama (neuro)   Encounter Date: 05/02/2021   OT End of Session - 05/03/21 1541     Visit Number 1    Number of Visits 12    Date for OT Re-Evaluation 06/14/21    OT Start Time 1300    OT Stop Time 1405    OT Time Calculation (min) 65 min    Activity Tolerance Patient tolerated treatment well    Behavior During Therapy [facility] for tasks assessed/performed             Past Medical History:  Diagnosis Date   UTI (urinary tract infection)    H/O one    Past Surgical History:  Procedure Laterality Date   APPENDECTOMY     LAPAROSCOPIC APPENDECTOMY N/A 07/02/2016   Procedure: APPENDECTOMY LAPAROSCOPIC;  Surgeon: Clayburn Pert, MD;  Location: [facility] ORS;  Service: General;  Laterality: N/A;   WISDOM TOOTH EXTRACTION      There were no vitals filed for this visit.   Subjective Assessment - 05/03/21 1506     Subjective  "I can't tolerate driving very far right now.  The vibration of the wheel bothers my hands."    Pertinent History New onset of peripheral neuropathy over the last 2-3 months; hx of alcohol abuse (pt reports quitting mid Sept 2022), recently hospitalized at [facility] for starvation ketoacidosis and increased burning in hands.    Limitations peripheral neuropathy, bilat hands and feet    Patient Stated Goals "To be able to use my hands better."    Currently in Pain? No/denies   no pain at present but tingling in bilat hands              [facility] OT Assessment - 05/03/21 0001       Assessment   Medical Diagnosis peripheral neuropathy    Referring Provider (OT) Dr. Melrose Nakayama (neuro)    Onset Date/Surgical Date 04/01/21    Hand Dominance Right    Next MD Visit Nov  30th Dr. Melrose Nakayama    Prior Therapy None      Precautions   Precautions None    Required Braces or Orthoses Other Brace/Splint   pt has bilat carpal tunnel braces that she wears on occasion     Restrictions   Weight Bearing Restrictions No      Balance Screen   Has the patient fallen in the past 6 months No      Home  Environment   Family/patient expects to be discharged to: Private residence    Living Arrangements Other(Comment)   roommate   Available Help at Discharge Friend(s)    Type of [place] One level    Alternate Level Stairs - Number of Steps 5 steps through garage and front    Bathroom Shower/Tub Tub/Shower unit;[place] None      Prior Function   Level of Independence Independent    Vocation Full time employment   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      IADL   Prior Level of Function Haematologist own vehicle   short distances only; pt unable to tolerate her 65 mile drive to work d/t vibration of steering wheel intolerable to bilat hands   Medication Management Is responsible for taking medication in correct dosages at correct time   increased difficulty opening medication bottles      Mobility   Mobility Status Comments indep without AD; pt reports occasionally off balance d/t neuropathy also in feet      Written Expression   Dominant Hand Right    Handwriting Increased time;100% legible   pt reports awkwardness holding pen     Vision - History   Baseline Vision No visual deficits    Additional Comments no recent visual changes      Cognition   Overall Cognitive Status Within Functional Limits for tasks assessed      Observation/Other Assessments   Focus on Therapeutic Outcomes (FOTO)  47      Sensation   Light Touch Impaired Detail    Light Touch Impaired Details Impaired RUE;Impaired LUE   increased tingling on volar aspects of both hands; burning, numbness, tingling bilaterally hands and feet   Stereognosis Impaired by gross assessment;Impaired Detail    Stereognosis Impaired Details Impaired RUE;Impaired LUE   able to correctly identify 4/5 familiar objects in R hand, and 1/5 in L hand   Hot/Cold Impaired by gross assessment;Impaired Detail    Hot/Cold Impaired Details Impaired RUE;Impaired LUE   pt reports inability to tell if water is too hot or too cold for bath when testing in either hand     Coordination   Gross Motor Movements are Fluid and Coordinated Yes    Fine Motor Movements are Fluid and Coordinated No   L hand dexterity more impaired than R   Right 9 Hole Peg Test 23 sec    Left 9 Hole Peg Test 26 sec      AROM   Overall AROM Comments bilat wrist ext 0-60      Strength   Overall Strength Comments Proximal strength grossly intact 5/5 BUEs; Distal weakness in bilat hands, L more so than R      Hand Function   Right Hand Grip (lbs) 57    Right Hand Lateral Pinch 14 lbs    Right Hand 3 Point Pinch 14 lbs    Left Hand Grip (lbs) 46    Left Hand Lateral Pinch 12 lbs    Left 3 point pinch 8 lbs            Occupational Therapy Evaluation: Pt is a 40 y/o female referred to outpatient OT d/t recent onset of peripheral neuropathy,  contributing to ADL decline.  Pt was hospitalized at [facility] from 04/01/21-04/03/21 for starvation ketoacidosis.  Pt has been unable to work since this recent hospitalization d/t weakness from weight  loss, debilitating burning and tingling in bilat hands and feet, with pt reporting inability to tolerate driving 50 miles to work d/t vibration of steering wheel being intolerable for more than a few minutes.  Pt with hx of alcohol abuse, recently quit drinking mid Sept of 2022.  Tingling in hands and feet began around then.  Doctors are questioning neuropathy induced by alcohol vs MS.  Pt had MRI recently to r/o MS and has follow up appointment with neurologist, Dr. Melrose Nakayama, on 05/29/21.  Pt's leisure interests involve extensive [facility] for crafting and woodworking, which she is currently unable to do.  Pt reports increased weakness in bilat hands, decreased coordination, tingling/numbness in bilat hands, all causing increased difficulty with clothing fasteners, cutting food, opening containers, and driving.  Pt will benefit from skilled OT for AE training as needed, instruction in modified ADL techniques, joint protection/positioning, alternate pain management strategies, and instruction in HEP in order to maximize ADL/IADL/work/leisure activities.   Therapeutic Exercise: Issued green theraputty and instructed pt in strengthening and coordination exercises for bilat hands, including gross grasping, lateral/2 point/3 point pinching, digit abd/add, and digging coins out of putty.  Able to return demo with intermittent vc for technique to improve quality of movement.  Encouraged completion 5-10 min, 1-2x per day.  Pt able to return demo.      OT Education - 05/03/21 1540     Education Details HEP with theraputty    Person(s) Educated Patient    Methods Explanation;Demonstration;Verbal cues;Handout    Comprehension Verbalized understanding;Returned demonstration;Need further instruction;Verbal cues required               OT Short Term Goals - 05/03/21 1546       OT SHORT TERM GOAL #1   Title Pt will be indep to utilize non-pharmacological pain management methods to manage pain/tingling in bilat hands to enable pt to tolerate 50 mile drive to work.    Baseline Eval: not yet initiated    Time 3    Period Weeks    Status New    Target Date 05/23/21      OT SHORT TERM GOAL #2   Title Pt will be indep with HEP for improving strength and coordination in distal BUEs.    Baseline Eval: initiated HEP, further training needed    Time 3    Period Weeks    Status New    Target Date 05/23/21               OT Long Term Goals - 05/03/21 1548       OT LONG TERM GOAL #1   Title Pt will increase FOTO score to 55 or better to indicate increased functional performance.    Baseline Eval: FOTO 47    Time 6    Period Weeks    Status New    Target Date 06/14/21      OT LONG TERM GOAL #2   Title Pt will manage clothing fasteners with modified indep, using AE as needed.    Baseline Eval: difficulty with small buttons, zippers, laces.    Time 6    Period Weeks    Status New    Target Date 06/14/21      OT LONG TERM GOAL #3   Title Pt will improve bilat grip strength to be indep to open containers and packages.    Baseline Eval: R grip 57 lbs, L grip 46 lbs (difficulty opening bottles, cans, packages)    Time 6  Period Weeks    Status New    Target Date 06/14/21                   Plan - 05/03/21 1542     Clinical Impression Statement Pt is a 40 y/o female referred to outpatient OT d/t recent onset of peripheral neuropathy, contributing to ADL decline.  Pt was hospitalized at [facility] from 04/01/21-04/03/21 for starvation ketoacidosis.  Pt has been unable to work since this recent hospitalization d/t weakness from weight loss, debilitating burning and tingling in bilat hands and feet, with pt reporting inability to tolerate driving 50 miles to work d/t vibration of  steering wheel being intolerable for more than a few minutes.  Pt with hx of alcohol abuse, recently quit drinking mid Sept of 2022.  Tingling in hands and feet began around then.  Doctors are questioning neuropathy induced by alcohol vs MS.  Pt had MRI recently to r/o MS and has follow up appointment with neurologist, Dr. Melrose Nakayama, on 05/29/21.  Pt's leisure interests involve extensive [facility] for crafting and woodworking, which she is currently unable to do.  Pt reports increased weakness in bilat hands, decreased coordination, tingling/numbness in bilat hands, all causing increased difficulty with clothing fasteners, cutting food, opening containers, and driving.  Pt will benefit from skilled OT for AE training as needed, instruction in modified ADL techniques, joint protection/positioning, alternate pain management strategies, and instruction in HEP in order to maximize ADL/IADL/work/leisure activities.    OT Occupational Profile and History Detailed Assessment- Review of Records and additional review of physical, cognitive, psychosocial history related to current functional performance    Occupational performance deficits (Please refer to evaluation for details): ADL's;IADL's;Leisure;Work    Marketing executive / Function / Physical Skills ADL;Dexterity;Flexibility;ROM;Strength;Coordination;FMC;IADL;Body mechanics;Pain;Sensation;UE functional use    Rehab Potential Good    Clinical Decision Making Several treatment options, min-mod task modification necessary    Comorbidities Affecting Occupational Performance: May have comorbidities impacting occupational performance    Modification or Assistance to Complete Evaluation  No modification of tasks or assist necessary to complete eval    OT Frequency 2x / week    OT Duration 6 weeks    OT Treatment/Interventions Self-care/ADL training;Cryotherapy;Therapeutic exercise;DME and/or AE instruction;Manual Therapy;Splinting;Moist Heat;Passive range of motion;Therapeutic  activities;Patient/family education;Coping strategies training    OT Home Exercise Plan green theraputty issued at eval    Recommended Other Services may benefit from PT d/t report of balance issues and LE weakness, but pt wants to begin OT and reassess need for PT later    Consulted and Agree with Plan of Care Patient             Patient will benefit from skilled therapeutic intervention in order to improve the following deficits and impairments:   Body Structure / Function / Physical Skills: ADL, Dexterity, Flexibility, ROM, Strength, Coordination, FMC, IADL, Body mechanics, Pain, Sensation, UE functional use       Visit Diagnosis: Muscle weakness (generalized)  Other lack of coordination    Problem List Patient Active Problem List   Diagnosis Date Noted   H/O appendicitis 07/23/2016   S/P laparoscopic appendectomy 07/23/2016   PTSD (post-traumatic stress disorder) 05/26/2016   Alcoholism and drug addiction in family 46/27/0350   Acute alcoholic hepatitis 09/38/1829   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   Hypertriglyceridemia 05/26/2016   Smoker unmotivated to quit 05/26/2016   Smokeless tobacco use 05/26/2016   Obesity, Class I, BMI 30.0-34.9 (see actual BMI) 05/26/2016  Alcohol use disorder, severe, dependence ([place]) 05/15/2016   Generalized anxiety disorder 10/30/2014   Encounter to establish care 10/30/2014   Encounter for routine gynecological examination 10/30/2014   Routine general medical examination at a health care facility 10/30/2014   Leta Speller, MS, OTR/L  Darleene Cleaver, OT/L 05/03/2021, 4:07 PM  [place], [place], [address] Phone: [phone]   Fax:  [phone]  Name: LIZZETH MEDER MRN: [id number] Date of Birth: 10-27-1980

## 2021-05-06 ENCOUNTER — Other Ambulatory Visit: Payer: Self-pay

## 2021-05-06 ENCOUNTER — Ambulatory Visit: Payer: BC Managed Care – PPO

## 2021-05-06 DIAGNOSIS — R278 Other lack of coordination: Secondary | ICD-10-CM | POA: Diagnosis not present

## 2021-05-06 DIAGNOSIS — M6281 Muscle weakness (generalized): Secondary | ICD-10-CM

## 2021-05-06 NOTE — Therapy (Signed)
[place] MAIN [facility] SERVICES [address] [place], [place], [address] Phone: [phone]   Fax:  [phone]  Occupational Therapy Treatment  Patient Details  Name: Stacey Moreno MRN: [id number] Date of Birth: 10/06/1980 Referring Provider (OT): Dr. Melrose Nakayama (neuro)   Encounter Date: 05/06/2021   OT End of Session - 05/06/21 1412     Visit Number 2    Number of Visits 12    Date for OT Re-Evaluation 06/14/21    OT Start Time 1345    OT Stop Time 1430    OT Time Calculation (min) 45 min    Activity Tolerance Patient tolerated treatment well    Behavior During Therapy WFL for tasks assessed/performed             Past Medical History:  Diagnosis Date   UTI (urinary tract infection)    H/O one    Past Surgical History:  Procedure Laterality Date   APPENDECTOMY     LAPAROSCOPIC APPENDECTOMY N/A 07/02/2016   Procedure: APPENDECTOMY LAPAROSCOPIC;  Surgeon: Clayburn Pert, MD;  Location: [facility] ORS;  Service: General;  Laterality: N/A;   WISDOM TOOTH EXTRACTION      There were no vitals filed for this visit.   Subjective Assessment - 05/06/21 1351     Subjective  Pt reports driving in to the clinic today, and improved pain.    Pertinent History New onset of peripheral neuropathy over the last 2-3 months; hx of alcohol abuse (pt reports quitting mid Sept 2022), recently hospitalized at [facility] for starvation ketoacidosis and increased burning in hands.    Limitations peripheral neuropathy, bilat hands and feet    Patient Stated Goals "To be able to use my hands better."    Currently in Pain? No/denies              Self Care Instructed in joint protection strategies and ADL adaptations for steering wheel, button pressing, and bottle opening. Pt plans to buy steering wheel cover to minimize vibrations/tingling during driving and to buy button dots to improve ability to identify buttons on microwave.   Therapeutic  Exercise 3# dumbbell for 1 set x 10 reps each side - forearm supination/pronation, wrist flexion/extension, and radial deviation. Pt requires MIN cues for proper technique with good carry over and recall. Pt performed blue theraband seated exercises for UE strengthening secondary to weakness. 1 set x 10 reps each for bilateral shoulder flexion, chest pull apart, shoulder external/internal rotation, scapular retractions, and elbow flexion/extension were performed. Pt instructed in various stretched to address tightness/pain at night time including hamstring, tibialis anterior, calf stretch, and hip flexor.     [employer] access to Home Exercise Program Access Code: [email or web address] URL: [email or web address] Date: 05/06/2021 Prepared by: Dessie Coma       OT Education - 05/06/21 1411     Education Details HEP with theraband and dumbbell    Person(s) Educated Patient    Methods Explanation;Demonstration;Verbal cues;Handout    Comprehension Verbalized understanding;Returned demonstration;Need further instruction;Verbal cues required              OT Short Term Goals - 05/03/21 1546       OT SHORT TERM GOAL #1   Title Pt will be indep to utilize non-pharmacological pain management methods to manage pain/tingling in bilat hands to enable pt to tolerate 50 mile drive to work.    Baseline Eval: not yet initiated    Time 3    Period Weeks  Status New    Target Date 05/23/21      OT SHORT TERM GOAL #2   Title Pt will be indep with HEP for improving strength and coordination in distal BUEs.    Baseline Eval: initiated HEP, further training needed    Time 3    Period Weeks    Status New    Target Date 05/23/21               OT Long Term Goals - 05/03/21 1548       OT LONG TERM GOAL #1   Title Pt will increase FOTO score to 55 or better to indicate increased functional performance.    Baseline Eval: FOTO 47    Time 6    Period Weeks    Status New    Target  Date 06/14/21      OT LONG TERM GOAL #2   Title Pt will manage clothing fasteners with modified indep, using AE as needed.    Baseline Eval: difficulty with small buttons, zippers, laces.    Time 6    Period Weeks    Status New    Target Date 06/14/21      OT LONG TERM GOAL #3   Title Pt will improve bilat grip strength to be indep to open containers and packages.    Baseline Eval: R grip 57 lbs, L grip 46 lbs (difficulty opening bottles, cans, packages)    Time 6    Period Weeks    Status New    Target Date 06/14/21                   Plan - 05/06/21 1412     Clinical Impression Statement Pt reports improved pain however continues to be limited in I/ADLs by numbness/weakness. Pt completes exercises and stretches with good carryover of technique, instructed in HEP. Instructed in joint protection strategies and ADL adaptations for steering wheel, button pressing, and bottle opening. Pt reports increased weakness in bilat hands, decreased coordination, tingling/numbness in bilat hands, all causing increased difficulty with clothing fasteners, cutting food, opening containers, and driving.  Pt will benefit from skilled OT for AE training as needed, instruction in modified ADL techniques, joint protection/positioning, alternate pain management strategies, and instruction in HEP in order to maximize ADL/IADL/work/leisure activities.    OT Occupational Profile and History Detailed Assessment- Review of Records and additional review of physical, cognitive, psychosocial history related to current functional performance    Occupational performance deficits (Please refer to evaluation for details): ADL's;IADL's;Leisure;Work    Marketing executive / Function / Physical Skills ADL;Dexterity;Flexibility;ROM;Strength;Coordination;FMC;IADL;Body mechanics;Pain;Sensation;UE functional use    Rehab Potential Good    Clinical Decision Making Several treatment options, min-mod task modification necessary     Comorbidities Affecting Occupational Performance: May have comorbidities impacting occupational performance    Modification or Assistance to Complete Evaluation  No modification of tasks or assist necessary to complete eval    OT Frequency 2x / week    OT Duration 6 weeks    OT Treatment/Interventions Self-care/ADL training;Cryotherapy;Therapeutic exercise;DME and/or AE instruction;Manual Therapy;Splinting;Moist Heat;Passive range of motion;Therapeutic activities;Patient/family education;Coping strategies training    OT Home Exercise Plan green theraputty issued at eval    Recommended Other Services may benefit from PT d/t report of balance issues and LE weakness, but pt wants to begin OT and reassess need for PT later    Consulted and Agree with Plan of Care Patient  Patient will benefit from skilled therapeutic intervention in order to improve the following deficits and impairments:   Body Structure / Function / Physical Skills: ADL, Dexterity, Flexibility, ROM, Strength, Coordination, FMC, IADL, Body mechanics, Pain, Sensation, UE functional use       Visit Diagnosis: Muscle weakness (generalized)  Other lack of coordination    Problem List Patient Active Problem List   Diagnosis Date Noted   H/O appendicitis 07/23/2016   S/P laparoscopic appendectomy 07/23/2016   PTSD (post-traumatic stress disorder) 05/26/2016   Alcoholism and drug addiction in family 70/17/7939   Acute alcoholic hepatitis 03/00/9233   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 OTR/L  05/06/21, 4:39 PM  ascom [phone]   [place] MAIN [facility] SERVICES [place], [place], [address] Phone: [phone]   Fax:  [phone]  Name: BRYER GOTTSCH MRN: [id number] Date of Birth: 06-25-1981

## 2021-05-08 ENCOUNTER — Other Ambulatory Visit: Payer: Self-pay

## 2021-05-08 ENCOUNTER — Ambulatory Visit: Payer: BC Managed Care – PPO | Admitting: Occupational Therapy

## 2021-05-08 DIAGNOSIS — M6281 Muscle weakness (generalized): Secondary | ICD-10-CM

## 2021-05-08 DIAGNOSIS — R278 Other lack of coordination: Secondary | ICD-10-CM | POA: Diagnosis not present

## 2021-05-09 ENCOUNTER — Encounter: Payer: Self-pay | Admitting: Occupational Therapy

## 2021-05-09 NOTE — Therapy (Signed)
[place] MAIN [facility] SERVICES [address] [place], [place], [address] Phone: [phone]   Fax:  [phone]  Occupational Therapy Treatment  Patient Details  Name: Stacey Moreno MRN: [id number] Date of Birth: 1980/11/05 Referring Provider (OT): Dr. Melrose Nakayama (neuro)   Encounter Date: 05/08/2021   OT End of Session - 05/09/21 0827     Visit Number 3    Number of Visits 12    Date for OT Re-Evaluation 06/14/21    OT Start Time 1350    OT Stop Time 1430    OT Time Calculation (min) 40 min    Activity Tolerance Patient tolerated treatment well    Behavior During Therapy WFL for tasks assessed/performed             Past Medical History:  Diagnosis Date   UTI (urinary tract infection)    H/O one    Past Surgical History:  Procedure Laterality Date   APPENDECTOMY     LAPAROSCOPIC APPENDECTOMY N/A 07/02/2016   Procedure: APPENDECTOMY LAPAROSCOPIC;  Surgeon: Clayburn Pert, MD;  Location: [facility] ORS;  Service: General;  Laterality: N/A;   WISDOM TOOTH EXTRACTION      There were no vitals filed for this visit.   Subjective Assessment - 05/09/21 0826     Subjective  Pt reports driving in to the clinic today, and improved pain.    Pertinent History New onset of peripheral neuropathy over the last 2-3 months; hx of alcohol abuse (pt reports quitting mid Sept 2022), recently hospitalized at [facility] for starvation ketoacidosis and increased burning in hands.    Limitations peripheral neuropathy, bilat hands and feet    Currently in Pain? No/denies            OT TREATMENT    Therapeutic Exercise:  Pt. performed gross gripping with a gross grip strengthener. Pt. worked on sustaining grip while grasping pegs and reaching at various heights. The Gripper was set to   17.9# of grip strength resistance. Pt. Worked on pinch strengthening in the bilateral hands for lateral, and 3pt. pinch using yellow, red, green, and blue resistive  clips. Pt. worked on placing the clips at various vertical and horizontal angles. Tactile and verbal cues were required for eliciting the desired movement.   Pt. education was provided about ACE wrap application for bilateral hands, wrist support. Pt. was able to demonstrate the proper ACE wrap application for wrist. Pt. Was provided with a large Pen Grip as well as education about the Pen grip position for comfort. Pt. was able to tolerate bilateral grip strength, and pinch strength. Pt. Continues to work on improving UE strength, and [facility] skills in order to work towards improving, and maximizing independence with ADLs, and IADL tasks. Pt. Continues to benefit from education about work simplification strategies for ADLs, and IADL tasks.                       OT Education - 05/09/21 0827     Education Details HEP with theraband and dumbbell    Person(s) Educated Patient    Methods Explanation;Demonstration;Verbal cues;Handout    Comprehension Verbalized understanding;Returned demonstration;Need further instruction;Verbal cues required              OT Short Term Goals - 05/03/21 1546       OT SHORT TERM GOAL #1   Title Pt will be indep to utilize non-pharmacological pain management methods to manage pain/tingling in bilat hands to  enable pt to tolerate 50 mile drive to work.    Baseline Eval: not yet initiated    Time 3    Period Weeks    Status New    Target Date 05/23/21      OT SHORT TERM GOAL #2   Title Pt will be indep with HEP for improving strength and coordination in distal BUEs.    Baseline Eval: initiated HEP, further training needed    Time 3    Period Weeks    Status New    Target Date 05/23/21               OT Long Term Goals - 05/03/21 1548       OT LONG TERM GOAL #1   Title Pt will increase FOTO score to 55 or better to indicate increased functional performance.    Baseline Eval: FOTO 47    Time 6    Period Weeks    Status New     Target Date 06/14/21      OT LONG TERM GOAL #2   Title Pt will manage clothing fasteners with modified indep, using AE as needed.    Baseline Eval: difficulty with small buttons, zippers, laces.    Time 6    Period Weeks    Status New    Target Date 06/14/21      OT LONG TERM GOAL #3   Title Pt will improve bilat grip strength to be indep to open containers and packages.    Baseline Eval: R grip 57 lbs, L grip 46 lbs (difficulty opening bottles, cans, packages)    Time 6    Period Weeks    Status New    Target Date 06/14/21                   Plan - 05/09/21 2409     Clinical Impression Statement Pt. education was provided about ACE wrap application for bilateral hands, wrist support. Pt. was able to demonstrate the proper ACE wrap application for wrist. Pt. Was provided with a large Pen Grip as well as education about the Pen grip position for comfort. Pt. was able to tolerate bilateral grip strength, and pinch strength. Pt. Continues to work on improving UE strength, and [facility] skills in order to work towards improving, and maximizing independence with ADLs, and IADL tasks. Pt. Continues to benefit from education about work simplification strategies for ADLs, and IADL tasks.   OT Occupational Profile and History Detailed Assessment- Review of Records and additional review of physical, cognitive, psychosocial history related to current functional performance    Occupational performance deficits (Please refer to evaluation for details): ADL's;IADL's;Leisure;Work    Marketing executive / Function / Physical Skills ADL;Dexterity;Flexibility;ROM;Strength;Coordination;FMC;IADL;Body mechanics;Pain;Sensation;UE functional use    Rehab Potential Good    Clinical Decision Making Several treatment options, min-mod task modification necessary    Comorbidities Affecting Occupational Performance: May have comorbidities impacting occupational performance    Modification or Assistance to Complete  Evaluation  No modification of tasks or assist necessary to complete eval    OT Frequency 2x / week    OT Duration 6 weeks    OT Treatment/Interventions Self-care/ADL training;Cryotherapy;Therapeutic exercise;DME and/or AE instruction;Manual Therapy;Splinting;Moist Heat;Passive range of motion;Therapeutic activities;Patient/family education;Coping strategies training    Recommended Other Services may benefit from PT d/t report of balance issues and LE weakness, but pt wants to begin OT and reassess need for PT later    Consulted and Agree with Plan of  Care Patient             Patient will benefit from skilled therapeutic intervention in order to improve the following deficits and impairments:   Body Structure / Function / Physical Skills: ADL, Dexterity, Flexibility, ROM, Strength, Coordination, FMC, IADL, Body mechanics, Pain, Sensation, UE functional use       Visit Diagnosis: Muscle weakness (generalized)  Other lack of coordination    Problem List Patient Active Problem List   Diagnosis Date Noted   H/O appendicitis 07/23/2016   S/P laparoscopic appendectomy 07/23/2016   PTSD (post-traumatic stress disorder) 05/26/2016   Alcoholism and drug addiction in family 53/29/9242   Acute alcoholic hepatitis 68/34/1962   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   Hypertriglyceridemia 05/26/2016   Smoker unmotivated to quit 05/26/2016   Smokeless tobacco use 05/26/2016   Obesity, Class I, BMI 30.0-34.9 (see actual BMI) 05/26/2016   Alcohol use disorder, severe, dependence ([place]) 05/15/2016   Generalized anxiety disorder 10/30/2014   Encounter to establish care 10/30/2014   Encounter for routine gynecological examination 10/30/2014   Routine general medical examination at a health care facility 10/30/2014    Harrel Carina, MS, OTR/L 05/09/2021, 8:30 AM  [place], [place],  [address] Phone: [phone]   Fax:  [phone]  Name: Stacey Moreno MRN: [id number] Date of Birth: 04/23/1981

## 2021-05-13 ENCOUNTER — Other Ambulatory Visit: Payer: Self-pay

## 2021-05-13 ENCOUNTER — Ambulatory Visit: Payer: BC Managed Care – PPO

## 2021-05-13 DIAGNOSIS — R278 Other lack of coordination: Secondary | ICD-10-CM | POA: Diagnosis not present

## 2021-05-13 DIAGNOSIS — M6281 Muscle weakness (generalized): Secondary | ICD-10-CM | POA: Diagnosis not present

## 2021-05-13 NOTE — Therapy (Signed)
[place] MAIN [facility] SERVICES [address] [place], [place], [address] Phone: [phone]   Fax:  [phone]  Occupational Therapy Treatment  Patient Details  Name: Stacey Moreno MRN: [id number] Date of Birth: [birth year] Referring Provider (OT): Dr. Melrose Nakayama (neuro)   Encounter Date: 05/13/2021   OT End of Session - 05/13/21 1702     Visit Number 4    Number of Visits 12    Date for OT Re-Evaluation 06/14/21    OT Start Time 1348    OT Stop Time 1430    OT Time Calculation (min) 42 min    Activity Tolerance Patient tolerated treatment well    Behavior During Therapy WFL for tasks assessed/performed             Past Medical History:  Diagnosis Date   UTI (urinary tract infection)    H/O one    Past Surgical History:  Procedure Laterality Date   APPENDECTOMY     LAPAROSCOPIC APPENDECTOMY N/A 07/02/2016   Procedure: APPENDECTOMY LAPAROSCOPIC;  Surgeon: Clayburn Pert, MD;  Location: [facility] ORS;  Service: General;  Laterality: N/A;   WISDOM TOOTH EXTRACTION      There were no vitals filed for this visit.   Subjective Assessment - 05/13/21 1659     Subjective  Pt reports overall improvement in severity of her neuropathy symptoms.    Pertinent History New onset of peripheral neuropathy over the last 2-3 months; hx of alcohol abuse (pt reports quitting mid Sept 2022), recently hospitalized at [facility] for starvation ketoacidosis and increased burning in hands.    Limitations peripheral neuropathy, bilat hands and feet    Patient Stated Goals "To be able to use my hands better."    Currently in Pain? No/denies    Multiple Pain Sites No             Occupational Therapy Treatment: Self Care: Instructed pt in use of button hook and zipper pull for managing clothing fasteners. Pt able to return demo and felt tool to be effective.  OT advised on options to purchase; pt plans to order tool.   Therapeutic  Exercise: Facilitated hand strengthening with use of hand gripper set at 17.9# to remove jumbo pegs from pegboard x2 trials each hand.  Facilitated pinch strengthening with use of therapy resistant clothespins to target lateral and 3 point pinch of R/L hand.  Able to pinch all colors today, 2 trials each hand.  Response to Treatment: Pt reports improving ability to tie shoe laces but small buttons remain challenging.  Pt plans to order button hook after effective use of button hook during session today.  Pt continues to develop increased grip and pinch strength in bilat hands.  Continued OT will benefit pt to maximize strength and coordination in bilat hands, as well as to instruct in adaptive equipment and ADL strategies to maximize indep and tolerance to daily activities.     OT Education - 05/13/21 1702     Education Details review of ACE wrap technique for bilat wrists    Person(s) Educated Patient    Methods Explanation;Demonstration;Verbal cues;Handout    Comprehension Verbalized understanding;Returned demonstration;Need further instruction;Verbal cues required              OT Short Term Goals - 05/03/21 1546       OT SHORT TERM GOAL #1   Title Pt will be indep to utilize non-pharmacological pain management methods to manage pain/tingling in bilat hands to enable  pt to tolerate 50 mile drive to work.    Baseline Eval: not yet initiated    Time 3    Period Weeks    Status New    Target Date 05/23/21      OT SHORT TERM GOAL #2   Title Pt will be indep with HEP for improving strength and coordination in distal BUEs.    Baseline Eval: initiated HEP, further training needed    Time 3    Period Weeks    Status New    Target Date 05/23/21               OT Long Term Goals - 05/03/21 1548       OT LONG TERM GOAL #1   Title Pt will increase FOTO score to 55 or better to indicate increased functional performance.    Baseline Eval: FOTO 47    Time 6    Period Weeks     Status New    Target Date 06/14/21      OT LONG TERM GOAL #2   Title Pt will manage clothing fasteners with modified indep, using AE as needed.    Baseline Eval: difficulty with small buttons, zippers, laces.    Time 6    Period Weeks    Status New    Target Date 06/14/21      OT LONG TERM GOAL #3   Title Pt will improve bilat grip strength to be indep to open containers and packages.    Baseline Eval: R grip 57 lbs, L grip 46 lbs (difficulty opening bottles, cans, packages)    Time 6    Period Weeks    Status New    Target Date 06/14/21               Plan - 05/13/21 1716     Clinical Impression Statement Pt reports improving ability to tie shoe laces but small buttons remain challenging.  Pt plans to order button hook after effective use of button hook during session today.  Pt continues to develop increased grip and pinch strength in bilat hands.  Continued OT will benefit pt to maximize strength and coordination in bilat hands, as well as to instruct in adaptive equipment and ADL strategies to maximize indep and tolerance to daily activities.    OT Occupational Profile and History Detailed Assessment- Review of Records and additional review of physical, cognitive, psychosocial history related to current functional performance    Occupational performance deficits (Please refer to evaluation for details): ADL's;IADL's;Leisure;Work    Marketing executive / Function / Physical Skills ADL;Dexterity;Flexibility;ROM;Strength;Coordination;FMC;IADL;Body mechanics;Pain;Sensation;UE functional use    Rehab Potential Good    Clinical Decision Making Several treatment options, min-mod task modification necessary    Comorbidities Affecting Occupational Performance: May have comorbidities impacting occupational performance    Modification or Assistance to Complete Evaluation  No modification of tasks or assist necessary to complete eval    OT Frequency 2x / week    OT Duration 6 weeks    OT  Treatment/Interventions Self-care/ADL training;Cryotherapy;Therapeutic exercise;DME and/or AE instruction;Manual Therapy;Splinting;Moist Heat;Passive range of motion;Therapeutic activities;Patient/family education;Coping strategies training    Recommended Other Services may benefit from PT d/t report of balance issues and LE weakness, but pt wants to begin OT and reassess need for PT later    Consulted and Agree with Plan of Care Patient             Patient will benefit from skilled therapeutic intervention in order to  improve the following deficits and impairments:   Body Structure / Function / Physical Skills: ADL, Dexterity, Flexibility, ROM, Strength, Coordination, FMC, IADL, Body mechanics, Pain, Sensation, UE functional use       Visit Diagnosis: Muscle weakness (generalized)  Other lack of coordination    Problem List Patient Active Problem List   Diagnosis Date Noted   H/O appendicitis 07/23/2016   S/P laparoscopic appendectomy 07/23/2016   PTSD (post-traumatic stress disorder) 05/26/2016   Alcoholism and drug addiction in family 23/41/4436   Acute alcoholic hepatitis 01/65/8006   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   Hypertriglyceridemia 05/26/2016   Smoker unmotivated to quit 05/26/2016   Smokeless tobacco use 05/26/2016   Obesity, Class I, BMI 30.0-34.9 (see actual BMI) 05/26/2016   Alcohol use disorder, severe, dependence ([place]) 05/15/2016   Generalized anxiety disorder 10/30/2014   Encounter to establish care 10/30/2014   Encounter for routine gynecological examination 10/30/2014   Routine general medical examination at a health care facility 10/30/2014   Leta Speller, MS, OTR/L  Darleene Cleaver, OT/L 05/13/2021, 5:18 PM  [place], [place], [address] Phone: [phone]   Fax:  [phone]  Name: Stacey Moreno MRN: [id number] Date of Birth:  05-May-1981

## 2021-05-15 ENCOUNTER — Other Ambulatory Visit: Payer: Self-pay

## 2021-05-15 ENCOUNTER — Ambulatory Visit: Payer: BC Managed Care – PPO

## 2021-05-15 DIAGNOSIS — R278 Other lack of coordination: Secondary | ICD-10-CM | POA: Diagnosis not present

## 2021-05-15 DIAGNOSIS — M6281 Muscle weakness (generalized): Secondary | ICD-10-CM

## 2021-05-15 NOTE — Therapy (Signed)
[place] MAIN [facility] SERVICES [address] [place], [place], [address] Phone: [phone]   Fax:  [phone]  Occupational Therapy Treatment  Patient Details  Name: Stacey Moreno MRN: [id number] Date of Birth: [birth year] Referring Provider (OT): Dr. Melrose Nakayama (neuro)   Encounter Date: 05/15/2021   OT End of Session - 05/15/21 1655     Visit Number 5    Number of Visits 12    Date for OT Re-Evaluation 06/14/21    OT Start Time 1355    OT Stop Time 1437    OT Time Calculation (min) 42 min    Activity Tolerance Patient tolerated treatment well    Behavior During Therapy [facility] for tasks assessed/performed             Past Medical History:  Diagnosis Date   UTI (urinary tract infection)    H/O one    Past Surgical History:  Procedure Laterality Date   APPENDECTOMY     LAPAROSCOPIC APPENDECTOMY N/A 07/02/2016   Procedure: APPENDECTOMY LAPAROSCOPIC;  Surgeon: Clayburn Pert, MD;  Location: [facility] ORS;  Service: General;  Laterality: N/A;   WISDOM TOOTH EXTRACTION      There were no vitals filed for this visit.   Subjective Assessment - 05/15/21 1651     Subjective  Pt reports her MRI was supposed to be yesterday but she wrote down her appointment time incorrectly, so it's now moved to the end of the month.    Pertinent History New onset of peripheral neuropathy over the last 2-3 months; hx of alcohol abuse (pt reports quitting mid Sept 2022), recently hospitalized at [facility] for starvation ketoacidosis and increased burning in hands.    Limitations peripheral neuropathy, bilat hands and feet    Patient Stated Goals "To be able to use my hands better."    Currently in Pain? Yes    Pain Score 1     Pain Location Finger (Comment which one)    Pain Orientation Right;Left    Pain Descriptors / Indicators Aching    Pain Type Chronic pain    Pain Radiating Towards fingertips    Pain Onset More than a month ago    Pain Frequency  Intermittent    Pain Relieving Factors medication    Effect of Pain on Daily Activities limited tolerance for driving/typing/writing and other [facility] tasks    Multiple Pain Sites No            Occupational Therapy Treatment: Therapeutic Exercise: Facilitated hand strengthening with use of hand gripper set at 11.2 lbs # for first trial, 17.9# trial 2 and 3, to remove jumbo pegs from pegboard x3 trials each hand.  Used EZ board to facilitate strengthening for pinch, grip, forearm pron/sup, wrist and digit flex/ext 3 reps up and down board for each hand with each task.  Instructed pt in self passive wrist and digit extension stretch and completed intermittently throughout session between gripping and pinching activities.  Good return demo.   Response to Treatment: Pt continues to report steady improvement with severity of neuropathy symptoms in bilat hands.  Pt decided not to order button hook d/t improving with management of small buttons.  Pt also endorses improved handwriting.  Pt continues to develop increased grip, pinch, and [facility] skills in order to maximize indep and efficiency with ADL/IADL/return to work tasks.     OT Education - 05/15/21 1654     Education Details [place] exercises    Person(s) Educated Patient  Methods Explanation;Demonstration;Verbal cues;Handout    Comprehension Verbalized understanding;Returned demonstration;Verbal cues required              OT Short Term Goals - 05/03/21 1546       OT SHORT TERM GOAL #1   Title Pt will be indep to utilize non-pharmacological pain management methods to manage pain/tingling in bilat hands to enable pt to tolerate 50 mile drive to work.    Baseline Eval: not yet initiated    Time 3    Period Weeks    Status New    Target Date 05/23/21      OT SHORT TERM GOAL #2   Title Pt will be indep with HEP for improving strength and coordination in distal BUEs.    Baseline Eval: initiated HEP, further training needed    Time 3     Period Weeks    Status New    Target Date 05/23/21               OT Long Term Goals - 05/03/21 1548       OT LONG TERM GOAL #1   Title Pt will increase FOTO score to 55 or better to indicate increased functional performance.    Baseline Eval: FOTO 47    Time 6    Period Weeks    Status New    Target Date 06/14/21      OT LONG TERM GOAL #2   Title Pt will manage clothing fasteners with modified indep, using AE as needed.    Baseline Eval: difficulty with small buttons, zippers, laces.    Time 6    Period Weeks    Status New    Target Date 06/14/21      OT LONG TERM GOAL #3   Title Pt will improve bilat grip strength to be indep to open containers and packages.    Baseline Eval: R grip 57 lbs, L grip 46 lbs (difficulty opening bottles, cans, packages)    Time 6    Period Weeks    Status New    Target Date 06/14/21              Plan - 05/15/21 1705     Clinical Impression Statement Pt continues to report steady improvement with severity of neuropathy symptoms in bilat hands.  Pt decided not to order button hook d/t improving with management of small buttons.  Pt also endorses improved handwriting.  Pt continues to develop increased grip, pinch, and [facility] skills in order to maximize indep and efficiency with ADL/IADL/return to work tasks.    OT Occupational Profile and History Detailed Assessment- Review of Records and additional review of physical, cognitive, psychosocial history related to current functional performance    Occupational performance deficits (Please refer to evaluation for details): ADL's;IADL's;Leisure;Work    Marketing executive / Function / Physical Skills ADL;Dexterity;Flexibility;ROM;Strength;Coordination;FMC;IADL;Body mechanics;Pain;Sensation;UE functional use    Rehab Potential Good    Clinical Decision Making Several treatment options, min-mod task modification necessary    Comorbidities Affecting Occupational Performance: May have comorbidities  impacting occupational performance    Modification or Assistance to Complete Evaluation  No modification of tasks or assist necessary to complete eval    OT Frequency 2x / week    OT Duration 6 weeks    OT Treatment/Interventions Self-care/ADL training;Cryotherapy;Therapeutic exercise;DME and/or AE instruction;Manual Therapy;Splinting;Moist Heat;Passive range of motion;Therapeutic activities;Patient/family education;Coping strategies training    Recommended Other Services may benefit from PT d/t report of balance issues and LE  weakness, but pt wants to begin OT and reassess need for PT later    Consulted and Agree with Plan of Care Patient             Patient will benefit from skilled therapeutic intervention in order to improve the following deficits and impairments:   Body Structure / Function / Physical Skills: ADL, Dexterity, Flexibility, ROM, Strength, Coordination, FMC, IADL, Body mechanics, Pain, Sensation, UE functional use       Visit Diagnosis: Muscle weakness (generalized)  Other lack of coordination    Problem List Patient Active Problem List   Diagnosis Date Noted   H/O appendicitis 07/23/2016   S/P laparoscopic appendectomy 07/23/2016   PTSD (post-traumatic stress disorder) 05/26/2016   Alcoholism and drug addiction in family 62/13/0865   Acute alcoholic hepatitis 78/46/9629   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   Hypertriglyceridemia 05/26/2016   Smoker unmotivated to quit 05/26/2016   Smokeless tobacco use 05/26/2016   Obesity, Class I, BMI 30.0-34.9 (see actual BMI) 05/26/2016   Alcohol use disorder, severe, dependence ([place]) 05/15/2016   Generalized anxiety disorder 10/30/2014   Encounter to establish care 10/30/2014   Encounter for routine gynecological examination 10/30/2014   Routine general medical examination at a health care facility 10/30/2014   Leta Speller, MS, OTR/L  Darleene Cleaver, OT/L 05/15/2021, 5:06 PM  [place], [place], [address] Phone: [phone]   Fax:  [phone]  Name: Stacey Moreno MRN: [id number] Date of Birth: September 09, 1980

## 2021-05-20 ENCOUNTER — Ambulatory Visit: Payer: BC Managed Care – PPO

## 2021-05-22 ENCOUNTER — Other Ambulatory Visit: Payer: Self-pay

## 2021-05-22 ENCOUNTER — Ambulatory Visit: Payer: BC Managed Care – PPO

## 2021-05-22 DIAGNOSIS — R278 Other lack of coordination: Secondary | ICD-10-CM

## 2021-05-22 DIAGNOSIS — M6281 Muscle weakness (generalized): Secondary | ICD-10-CM

## 2021-05-22 NOTE — Therapy (Signed)
[place] MAIN [facility] SERVICES [address] [place], [place], [address] Phone: [phone]   Fax:  [phone]  Occupational Therapy Treatment  Patient Details  Name: Stacey Moreno MRN: [id number] Date of Birth: [birth year] Referring Provider (OT): Dr. Melrose Nakayama (neuro)   Encounter Date: 05/22/2021   OT End of Session - 05/22/21 1456     Visit Number 6    Number of Visits 12    Date for OT Re-Evaluation 06/14/21    OT Start Time 1350    OT Stop Time 1430    OT Time Calculation (min) 40 min    Activity Tolerance Patient tolerated treatment well    Behavior During Therapy WFL for tasks assessed/performed             Past Medical History:  Diagnosis Date   UTI (urinary tract infection)    H/O one    Past Surgical History:  Procedure Laterality Date   APPENDECTOMY     LAPAROSCOPIC APPENDECTOMY N/A 07/02/2016   Procedure: APPENDECTOMY LAPAROSCOPIC;  Surgeon: Clayburn Pert, MD;  Location: [facility] ORS;  Service: General;  Laterality: N/A;   WISDOM TOOTH EXTRACTION      There were no vitals filed for this visit.   Subjective Assessment - 05/22/21 1453     Subjective  Pt reports planning return to work Dec. 1st.    Pertinent History New onset of peripheral neuropathy over the last 2-3 months; hx of alcohol abuse (pt reports quitting mid Sept 2022), recently hospitalized at [facility] for starvation ketoacidosis and increased burning in hands.    Limitations peripheral neuropathy, bilat hands and feet    Patient Stated Goals "To be able to use my hands better."    Currently in Pain? Yes    Pain Score 2     Pain Location Finger (Comment which one)    Pain Orientation Right;Left    Pain Descriptors / Indicators Aching;Tingling    Pain Type Chronic pain    Pain Radiating Towards fingertips    Pain Onset More than a month ago    Pain Frequency Intermittent    Aggravating Factors  touching and gripping    Pain Relieving Factors  medication/rest    Effect of Pain on Daily Activities limited tolerance for driving/typing/writing and other [facility] tasks    Multiple Pain Sites No            Occupational Therapy Treatment: Therapeutic Exercise: Facilitated small object pick up and manipulation placing grooved pegs into pegboard using R/L hands.  Pt was able to turn grooved pegs within fingertips to line up to grooves, and able to remove them with tweezers with good accuracy to sustain pinch; pt reports minimal difficulty with task as long as pt is looking at what she is doing with her hands, using vision to compensate for decreased sensation in fingertips.  Practiced picking up grooved pegs from flat table top; pt reports task to be fairly easy but more challenging with L hand.  Completed [place] pegboard task for R/L hands and removed pegs from pegboard with tweezers dropping only 1-2.  Good accuracy with placement.  Pt able to flip pegs 180 degrees within fingertips without dropping, continuing to use vision to compensate for numbness in fingertips.  Facilitated pinch strengthening with use of therapy resistant clothespins to target lateral and 3 point pinch of R/L hands.  Tolerated green, blue, and black pins both hands, with slight soreness in L wrist and hand, 2 trials each  hand for each pinch type.  Facilitated hand strengthening with use of hand gripper set at 17.9# to remove jumbo pegs from pegboard x1 trial each hand, working to increase grip strength for carrying heavy ADL supplies.  Response to Treatment: See Plan/clinical impression below.     OT Education - 05/22/21 1456     Education Details cut resistant gloves for meal prep    Person(s) Educated Patient    Methods Explanation;Verbal cues    Comprehension Verbalized understanding              OT Short Term Goals - 05/03/21 1546       OT SHORT TERM GOAL #1   Title Pt will be indep to utilize non-pharmacological pain management methods to manage  pain/tingling in bilat hands to enable pt to tolerate 50 mile drive to work.    Baseline Eval: not yet initiated    Time 3    Period Weeks    Status New    Target Date 05/23/21      OT SHORT TERM GOAL #2   Title Pt will be indep with HEP for improving strength and coordination in distal BUEs.    Baseline Eval: initiated HEP, further training needed    Time 3    Period Weeks    Status New    Target Date 05/23/21               OT Long Term Goals - 05/03/21 1548       OT LONG TERM GOAL #1   Title Pt will increase FOTO score to 55 or better to indicate increased functional performance.    Baseline Eval: FOTO 47    Time 6    Period Weeks    Status New    Target Date 06/14/21      OT LONG TERM GOAL #2   Title Pt will manage clothing fasteners with modified indep, using AE as needed.    Baseline Eval: difficulty with small buttons, zippers, laces.    Time 6    Period Weeks    Status New    Target Date 06/14/21      OT LONG TERM GOAL #3   Title Pt will improve bilat grip strength to be indep to open containers and packages.    Baseline Eval: R grip 57 lbs, L grip 46 lbs (difficulty opening bottles, cans, packages)    Time 6    Period Weeks    Status New    Target Date 06/14/21                   Plan - 05/22/21 1511     Clinical Impression Statement Pt reports slight soreness in bilat hands and wrists today, more so in the L hand, after unscrewing/screwing tire caps to check tire pressure in car yesterday.  Pt reports task was difficult, but pt was able to manage.  Pt continues to use vision to compensate for numbness in fingertips and is working to develop new habits to ensure consistent visual compensation for kitchen tasks to avoid cutting self.  Pt reports she is still reluctant to do much cutting d/t fear of injury, but plans to use kitchen choppers instead of knives to reduce cutting risk.  Pt will continue to benefit from skilled OT for increasing bilat  hand strength and coordination while instructing in compensatory ADL strategies as needed in order to maximize indep with daily tasks.    OT Occupational Profile and History  Detailed Assessment- Review of Records and additional review of physical, cognitive, psychosocial history related to current functional performance    Occupational performance deficits (Please refer to evaluation for details): ADL's;IADL's;Leisure;Work    Marketing executive / Function / Physical Skills ADL;Dexterity;Flexibility;ROM;Strength;Coordination;FMC;IADL;Body mechanics;Pain;Sensation;UE functional use    Rehab Potential Good    Clinical Decision Making Several treatment options, min-mod task modification necessary    Comorbidities Affecting Occupational Performance: May have comorbidities impacting occupational performance    Modification or Assistance to Complete Evaluation  No modification of tasks or assist necessary to complete eval    OT Frequency 2x / week    OT Duration 6 weeks    OT Treatment/Interventions Self-care/ADL training;Cryotherapy;Therapeutic exercise;DME and/or AE instruction;Manual Therapy;Splinting;Moist Heat;Passive range of motion;Therapeutic activities;Patient/family education;Coping strategies training    Recommended Other Services may benefit from PT d/t report of balance issues and LE weakness, but pt wants to begin OT and reassess need for PT later    Consulted and Agree with Plan of Care Patient             Patient will benefit from skilled therapeutic intervention in order to improve the following deficits and impairments:   Body Structure / Function / Physical Skills: ADL, Dexterity, Flexibility, ROM, Strength, Coordination, FMC, IADL, Body mechanics, Pain, Sensation, UE functional use       Visit Diagnosis: Muscle weakness (generalized)  Other lack of coordination    Problem List Patient Active Problem List   Diagnosis Date Noted   H/O appendicitis 07/23/2016   S/P  laparoscopic appendectomy 07/23/2016   PTSD (post-traumatic stress disorder) 05/26/2016   Alcoholism and drug addiction in family 70/96/2836   Acute alcoholic hepatitis 62/94/7654   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   Hypertriglyceridemia 05/26/2016   Smoker unmotivated to quit 05/26/2016   Smokeless tobacco use 05/26/2016   Obesity, Class I, BMI 30.0-34.9 (see actual BMI) 05/26/2016   Alcohol use disorder, severe, dependence ([place]) 05/15/2016   Generalized anxiety disorder 10/30/2014   Encounter to establish care 10/30/2014   Encounter for routine gynecological examination 10/30/2014   Routine general medical examination at a health care facility 10/30/2014   Leta Speller, MS, OTR/L  Darleene Cleaver, OT/L 05/22/2021, 3:11 PM  [place], [place], [address] Phone: [phone]   Fax:  [phone]  Name: DEMETRIUS BARRELL MRN: [id number] Date of Birth: 12/13/1980

## 2021-05-27 ENCOUNTER — Other Ambulatory Visit: Payer: Self-pay

## 2021-05-27 ENCOUNTER — Ambulatory Visit: Payer: BC Managed Care – PPO

## 2021-05-27 DIAGNOSIS — M6281 Muscle weakness (generalized): Secondary | ICD-10-CM | POA: Diagnosis not present

## 2021-05-27 DIAGNOSIS — R278 Other lack of coordination: Secondary | ICD-10-CM | POA: Diagnosis not present

## 2021-05-27 NOTE — Therapy (Signed)
[place] MAIN [facility] SERVICES [address] [place], [place], [address] Phone: [phone]   Fax:  [phone]  Occupational Therapy Treatment  Patient Details  Name: Stacey Moreno MRN: [id number] Date of Birth: 1980-11-13 Referring Provider (OT): Dr. Melrose Nakayama (neuro)   Encounter Date: 05/27/2021   OT End of Session - 05/27/21 1506     Visit Number 7    Number of Visits 12    Date for OT Re-Evaluation 06/14/21    OT Start Time 1345    OT Stop Time 1430    OT Time Calculation (min) 45 min    Activity Tolerance Patient tolerated treatment well    Behavior During Therapy WFL for tasks assessed/performed             Past Medical History:  Diagnosis Date   UTI (urinary tract infection)    H/O one    Past Surgical History:  Procedure Laterality Date   APPENDECTOMY     LAPAROSCOPIC APPENDECTOMY N/A 07/02/2016   Procedure: APPENDECTOMY LAPAROSCOPIC;  Surgeon: Clayburn Pert, MD;  Location: [facility] ORS;  Service: General;  Laterality: N/A;   WISDOM TOOTH EXTRACTION      There were no vitals filed for this visit.   Subjective Assessment - 05/27/21 1504     Subjective  Pt reports she will have her MRI tomorrow and a follow up with the neurologist on Wednesday.  Pt reports no pain today, but worsens at night time in bilat hands.    Pertinent History New onset of peripheral neuropathy over the last 2-3 months; hx of alcohol abuse (pt reports quitting mid Sept 2022), recently hospitalized at [facility] for starvation ketoacidosis and increased burning in hands.    Limitations peripheral neuropathy, bilat hands and feet    Patient Stated Goals "To be able to use my hands better."    Currently in Pain? No/denies    Pain Score 0-No pain    Pain Onset More than a month ago            Occupational Therapy Treatment: Self Care: Practiced placing various types of earring backs on earring stud, 75% accurate to avoid dropping backs.   Easier when pt is able to look at earrings to compensate for sensory impairment in fingertips.  Encouraged pt to use a front and back mirror at home for improved visual during this activity.  Pt receptive.  Issued small pen grip to have one for work and home, as pt continues to report aching in DIP and PIP joints when holding pen.    Therapeutic Exercise: Facilitated hand strengthening with use of hand gripper set at 23.4# to remove jumbo pegs from pegboard x3 trials each hand.  Facilitated pinch strengthening with use of therapy resistant clothespins (green, blue, black) to target lateral and 3 point pinch of R/L hands, 2 trials each hand with each pinch type.  Performed self passive wrist extension stretch following grip and pinch exercises.   Response to Treatment: See Plan/clinical impression below.      OT Education - 05/27/21 1505     Education Details environmental modification with 2 mirrors to enable putting in earrings, compensating for decreased sensation in fingertips.    Person(s) Educated Patient    Methods Explanation;Verbal cues    Comprehension Verbalized understanding              OT Short Term Goals - 05/03/21 1546       OT SHORT TERM GOAL #1  Title Pt will be indep to utilize non-pharmacological pain management methods to manage pain/tingling in bilat hands to enable pt to tolerate 50 mile drive to work.    Baseline Eval: not yet initiated    Time 3    Period Weeks    Status New    Target Date 05/23/21      OT SHORT TERM GOAL #2   Title Pt will be indep with HEP for improving strength and coordination in distal BUEs.    Baseline Eval: initiated HEP, further training needed    Time 3    Period Weeks    Status New    Target Date 05/23/21               OT Long Term Goals - 05/03/21 1548       OT LONG TERM GOAL #1   Title Pt will increase FOTO score to 55 or better to indicate increased functional performance.    Baseline Eval: FOTO 47    Time  6    Period Weeks    Status New    Target Date 06/14/21      OT LONG TERM GOAL #2   Title Pt will manage clothing fasteners with modified indep, using AE as needed.    Baseline Eval: difficulty with small buttons, zippers, laces.    Time 6    Period Weeks    Status New    Target Date 06/14/21      OT LONG TERM GOAL #3   Title Pt will improve bilat grip strength to be indep to open containers and packages.    Baseline Eval: R grip 57 lbs, L grip 46 lbs (difficulty opening bottles, cans, packages)    Time 6    Period Weeks    Status New    Target Date 06/14/21               Plan - 05/27/21 1709     Clinical Impression Statement Good tolerance to increase in grip setting from 17.9 lbs to 23.4 lbs for R/L hands using hand gripper to remove pegs.  Pt reporting no pain in bilat hands today, but typically increase in pain noted in the evenings which is improved with tylenol as needed.  Pt planning return to work this week on Dec. 1st, but planning to complete her therapy sessions as needed even once she returns to work.  Pt continues to report improved hand strength.  Remaining visits will continue to focus on increasing distal BUE strength and coordination and making recommendations for task modification for increasing indep with ADLs.    OT Occupational Profile and History Detailed Assessment- Review of Records and additional review of physical, cognitive, psychosocial history related to current functional performance    Occupational performance deficits (Please refer to evaluation for details): ADL's;IADL's;Leisure;Work    Marketing executive / Function / Physical Skills ADL;Dexterity;Flexibility;ROM;Strength;Coordination;FMC;IADL;Body mechanics;Pain;Sensation;UE functional use    Rehab Potential Good    Clinical Decision Making Several treatment options, min-mod task modification necessary    Comorbidities Affecting Occupational Performance: May have comorbidities impacting occupational  performance    Modification or Assistance to Complete Evaluation  No modification of tasks or assist necessary to complete eval    OT Frequency 2x / week    OT Duration 6 weeks    OT Treatment/Interventions Self-care/ADL training;Cryotherapy;Therapeutic exercise;DME and/or AE instruction;Manual Therapy;Splinting;Moist Heat;Passive range of motion;Therapeutic activities;Patient/family education;Coping strategies training    Recommended Other Services may benefit from PT d/t report of  balance issues and LE weakness, but pt wants to begin OT and reassess need for PT later    Consulted and Agree with Plan of Care Patient             Patient will benefit from skilled therapeutic intervention in order to improve the following deficits and impairments:   Body Structure / Function / Physical Skills: ADL, Dexterity, Flexibility, ROM, Strength, Coordination, FMC, IADL, Body mechanics, Pain, Sensation, UE functional use       Visit Diagnosis: Muscle weakness (generalized)  Other lack of coordination    Problem List Patient Active Problem List   Diagnosis Date Noted   H/O appendicitis 07/23/2016   S/P laparoscopic appendectomy 07/23/2016   PTSD (post-traumatic stress disorder) 05/26/2016   Alcoholism and drug addiction in family 60/03/9322   Acute alcoholic hepatitis 55/73/2202   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   Hypertriglyceridemia 05/26/2016   Smoker unmotivated to quit 05/26/2016   Smokeless tobacco use 05/26/2016   Obesity, Class I, BMI 30.0-34.9 (see actual BMI) 05/26/2016   Alcohol use disorder, severe, dependence ([place]) 05/15/2016   Generalized anxiety disorder 10/30/2014   Encounter to establish care 10/30/2014   Encounter for routine gynecological examination 10/30/2014   Routine general medical examination at a health care facility 10/30/2014   Leta Speller, MS, OTR/L  Darleene Cleaver, OT/L 05/27/2021, 5:10 PM  [place] [address] [place], [place], [address] Phone: [phone]   Fax:  [phone]  Name: AYUMI WANGERIN MRN: [id number] Date of Birth: 1981/02/14

## 2021-05-29 ENCOUNTER — Other Ambulatory Visit: Payer: Self-pay

## 2021-05-29 ENCOUNTER — Ambulatory Visit: Payer: BC Managed Care – PPO

## 2021-05-29 DIAGNOSIS — R278 Other lack of coordination: Secondary | ICD-10-CM | POA: Diagnosis not present

## 2021-05-29 DIAGNOSIS — M6281 Muscle weakness (generalized): Secondary | ICD-10-CM

## 2021-05-29 NOTE — Therapy (Signed)
[place] MAIN [facility] SERVICES [address] [place], [place], [address] Phone: [phone]   Fax:  [phone]  Occupational Therapy Treatment  Patient Details  Name: Stacey Moreno MRN: [id number] Date of Birth: 1981/02/11 Referring Provider (OT): Dr. Melrose Nakayama (neuro)   Encounter Date: 05/29/2021   OT End of Session - 05/29/21 1735     Visit Number 8    Number of Visits 12    Date for OT Re-Evaluation 06/14/21    OT Start Time 1351    OT Stop Time 1430    OT Time Calculation (min) 39 min    Activity Tolerance Patient tolerated treatment well    Behavior During Therapy [facility] for tasks assessed/performed             Past Medical History:  Diagnosis Date   UTI (urinary tract infection)    H/O one    Past Surgical History:  Procedure Laterality Date   APPENDECTOMY     LAPAROSCOPIC APPENDECTOMY N/A 07/02/2016   Procedure: APPENDECTOMY LAPAROSCOPIC;  Surgeon: Clayburn Pert, MD;  Location: [facility] ORS;  Service: General;  Laterality: N/A;   WISDOM TOOTH EXTRACTION      There were no vitals filed for this visit.   Subjective Assessment - 05/29/21 1733     Subjective  Pt receptive to decreasing to 1 time a week for the next 2 weeks, and then plan discharge.    Pertinent History New onset of peripheral neuropathy over the last 2-3 months; hx of alcohol abuse (pt reports quitting mid Sept 2022), recently hospitalized at [facility] for starvation ketoacidosis and increased burning in hands.    Limitations peripheral neuropathy, bilat hands and feet    Patient Stated Goals "To be able to use my hands better."    Currently in Pain? No/denies    Pain Score 0-No pain    Pain Onset More than a month ago            Occupational Therapy Treatment: Therapeutic Exercise: Reviewed/completed tendon glides each hand x5 each with good return demo.  Facilitated hand strengthening with use of hand gripper set at 23.4 # to remove jumbo pegs  from pegboard x3 trials each hand.  Facilitated pinch strengthening with use of therapy resistant clothespins (green, blue, black) to target lateral and 3 point pinch of R/L hand x 2 trials each hand with each pinch type.  Instructed pt in passive stretching for bilat wrist and digit extension between gripping and pinching exercises.   Response to Treatment: Bilat grip improving, with R grip now at 64 lbs, up from 57 at eval, L grip now 59 lbs, up from 46 lbs at eval.  Pt reports she can tell the difference with her improved grip with her daily activities.  Pt reports she's been working on tendon glides at home since last session and felt they helped with pain relief when experiencing tingling/neuropathic pain in hands.  Pt had MRI rescheduled this morning d/t running into traffic on her way to MRI this morning.  Pt will have MRI next Friday.  Planning decrease to 1x per visit for the next 2 weeks and then planning discharge.  Pt planning return to work tomorrow.  OT will continue to address grip and pinch strengthening and ADL modifications as needed over the next 2 sessions.    OT Education - 05/29/21 1734     Education Details tendon glides/HEP progression    Person(s) Educated Patient    Methods Explanation;Verbal  cues;Demonstration    Comprehension Verbalized understanding;Returned demonstration              OT Short Term Goals - 05/03/21 1546       OT SHORT TERM GOAL #1   Title Pt will be indep to utilize non-pharmacological pain management methods to manage pain/tingling in bilat hands to enable pt to tolerate 50 mile drive to work.    Baseline Eval: not yet initiated    Time 3    Period Weeks    Status New    Target Date 05/23/21      OT SHORT TERM GOAL #2   Title Pt will be indep with HEP for improving strength and coordination in distal BUEs.    Baseline Eval: initiated HEP, further training needed    Time 3    Period Weeks    Status New    Target Date 05/23/21                OT Long Term Goals - 05/03/21 1548       OT LONG TERM GOAL #1   Title Pt will increase FOTO score to 55 or better to indicate increased functional performance.    Baseline Eval: FOTO 47    Time 6    Period Weeks    Status New    Target Date 06/14/21      OT LONG TERM GOAL #2   Title Pt will manage clothing fasteners with modified indep, using AE as needed.    Baseline Eval: difficulty with small buttons, zippers, laces.    Time 6    Period Weeks    Status New    Target Date 06/14/21      OT LONG TERM GOAL #3   Title Pt will improve bilat grip strength to be indep to open containers and packages.    Baseline Eval: R grip 57 lbs, L grip 46 lbs (difficulty opening bottles, cans, packages)    Time 6    Period Weeks    Status New    Target Date 06/14/21               Plan - 05/29/21 1743     Clinical Impression Statement Bilat grip improving, with R grip now at 64 lbs, up from 57 at eval, L grip now 59 lbs, up from 46 lbs at eval.  Pt reports she can tell the difference with her improved grip with her daily activities.  Pt reports she's been working on tendon glides at home since last session and felt they helped with pain relief when experiencing tingling/neuropathic pain in hands.  Pt had MRI rescheduled this morning d/t running into traffic on her way to MRI this morning.  Pt will have MRI next Friday.  Planning decrease to 1x per visit for the next 2 weeks and then planning discharge.  Pt planning return to work tomorrow.  OT will continue to address grip and pinch strengthening and ADL modifications as needed over the next 2 sessions.    OT Occupational Profile and History Detailed Assessment- Review of Records and additional review of physical, cognitive, psychosocial history related to current functional performance    Occupational performance deficits (Please refer to evaluation for details): ADL's;IADL's;Leisure;Work    Marketing executive / Function / Physical  Skills ADL;Dexterity;Flexibility;ROM;Strength;Coordination;FMC;IADL;Body mechanics;Pain;Sensation;UE functional use    Rehab Potential Good    Clinical Decision Making Several treatment options, min-mod task modification necessary    Comorbidities Affecting Occupational Performance: May have  comorbidities impacting occupational performance    Modification or Assistance to Complete Evaluation  No modification of tasks or assist necessary to complete eval    OT Frequency 2x / week    OT Duration 6 weeks    OT Treatment/Interventions Self-care/ADL training;Cryotherapy;Therapeutic exercise;DME and/or AE instruction;Manual Therapy;Splinting;Moist Heat;Passive range of motion;Therapeutic activities;Patient/family education;Coping strategies training    Recommended Other Services may benefit from PT d/t report of balance issues and LE weakness, but pt wants to begin OT and reassess need for PT later    Consulted and Agree with Plan of Care Patient             Patient will benefit from skilled therapeutic intervention in order to improve the following deficits and impairments:   Body Structure / Function / Physical Skills: ADL, Dexterity, Flexibility, ROM, Strength, Coordination, FMC, IADL, Body mechanics, Pain, Sensation, UE functional use       Visit Diagnosis: Muscle weakness (generalized)  Other lack of coordination    Problem List Patient Active Problem List   Diagnosis Date Noted   H/O appendicitis 07/23/2016   S/P laparoscopic appendectomy 07/23/2016   PTSD (post-traumatic stress disorder) 05/26/2016   Alcoholism and drug addiction in family 09/73/5329   Acute alcoholic hepatitis 92/42/6834   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   Hypertriglyceridemia 05/26/2016   Smoker unmotivated to quit 05/26/2016   Smokeless tobacco use 05/26/2016   Obesity, Class I, BMI 30.0-34.9 (see actual BMI) 05/26/2016   Alcohol use disorder, severe, dependence ([place]) 05/15/2016    Generalized anxiety disorder 10/30/2014   Encounter to establish care 10/30/2014   Encounter for routine gynecological examination 10/30/2014   Routine general medical examination at a health care facility 10/30/2014   Leta Speller, MS, OTR/L  Darleene Cleaver, OT/L 05/29/2021, 5:43 PM  [place], [place], [address] Phone: [phone]   Fax:  [phone]  Name: Stacey Moreno MRN: [id number] Date of Birth: 08-26-1980

## 2021-06-05 ENCOUNTER — Ambulatory Visit: Payer: BC Managed Care – PPO | Attending: Neurology

## 2021-06-05 ENCOUNTER — Other Ambulatory Visit: Payer: Self-pay

## 2021-06-05 DIAGNOSIS — M6281 Muscle weakness (generalized): Secondary | ICD-10-CM | POA: Diagnosis not present

## 2021-06-05 DIAGNOSIS — R278 Other lack of coordination: Secondary | ICD-10-CM | POA: Diagnosis not present

## 2021-06-05 NOTE — Therapy (Signed)
[place] MAIN [facility] SERVICES [address] [place], [place], [address] Phone: [phone]   Fax:  [phone]  Occupational Therapy Treatment  Patient Details  Name: Stacey Moreno MRN: [id number] Date of Birth: 04-01-1981 Referring Provider (OT): Dr. Melrose Nakayama (neuro)   Encounter Date: 06/05/2021   OT End of Session - 06/05/21 1653     Visit Number 9    Number of Visits 12    Date for OT Re-Evaluation 06/14/21    OT Start Time 1351    OT Stop Time 1429    OT Time Calculation (min) 38 min    Activity Tolerance Patient tolerated treatment well    Behavior During Therapy [facility] for tasks assessed/performed             Past Medical History:  Diagnosis Date   UTI (urinary tract infection)    H/O one    Past Surgical History:  Procedure Laterality Date   APPENDECTOMY     LAPAROSCOPIC APPENDECTOMY N/A 07/02/2016   Procedure: APPENDECTOMY LAPAROSCOPIC;  Surgeon: Clayburn Pert, MD;  Location: [facility] ORS;  Service: General;  Laterality: N/A;   WISDOM TOOTH EXTRACTION      There were no vitals filed for this visit.   Subjective Assessment - 06/05/21 1651     Subjective  Pt reports no issues with return to work last week and is glad to be back.    Pertinent History New onset of peripheral neuropathy over the last 2-3 months; hx of alcohol abuse (pt reports quitting mid Sept 2022), recently hospitalized at [facility] for starvation ketoacidosis and increased burning in hands.    Limitations peripheral neuropathy, bilat hands and feet    Patient Stated Goals "To be able to use my hands better."    Currently in Pain? No/denies    Pain Score 0-No pain    Pain Onset More than a month ago            Occupational Therapy Treatment: Therapeutic Exercise: Facilitated hand strengthening with use of hand gripper set at 23.4# to remove jumbo pegs from pegboard x3 trials each hand.  Facilitated [employer] with construction of [place] pegboard  pieces using R hand for 1 trial, L hand for 2nd trial, and bilat for 3rd trial.  L with increased difficulty/slower speed but still WFL, dropped only a couple of pieces with L hand, none with R.  Performed tendon glides x3 each hand, min vc for technique.  Facilitated pinch strengthening with green, blue, and black clips for 2 trials each hand for increasing lateral pinch.   Response to Treatment: See Plan/clinical impression below.    OT Education - 06/05/21 1653     Education Details [place] tasks    Person(s) Educated Patient    Methods Explanation;Verbal cues;Demonstration    Comprehension Verbalized understanding;Returned demonstration              OT Short Term Goals - 05/03/21 1546       OT SHORT TERM GOAL #1   Title Pt will be indep to utilize non-pharmacological pain management methods to manage pain/tingling in bilat hands to enable pt to tolerate 50 mile drive to work.    Baseline Eval: not yet initiated    Time 3    Period Weeks    Status New    Target Date 05/23/21      OT SHORT TERM GOAL #2   Title Pt will be indep with HEP for improving strength and coordination in  distal BUEs.    Baseline Eval: initiated HEP, further training needed    Time 3    Period Weeks    Status New    Target Date 05/23/21               OT Long Term Goals - 05/03/21 1548       OT LONG TERM GOAL #1   Title Pt will increase FOTO score to 55 or better to indicate increased functional performance.    Baseline Eval: FOTO 47    Time 6    Period Weeks    Status New    Target Date 06/14/21      OT LONG TERM GOAL #2   Title Pt will manage clothing fasteners with modified indep, using AE as needed.    Baseline Eval: difficulty with small buttons, zippers, laces.    Time 6    Period Weeks    Status New    Target Date 06/14/21      OT LONG TERM GOAL #3   Title Pt will improve bilat grip strength to be indep to open containers and packages.    Baseline Eval: R grip 57 lbs, L grip  46 lbs (difficulty opening bottles, cans, packages)    Time 6    Period Weeks    Status New    Target Date 06/14/21             Plan - 06/05/21 1704     Clinical Impression Statement Pt reports smooth return to work last week with no new issues.  Pt reporting overall decrease in tingling to bilat fingertips, still present but less than at eval.  Planning 1 more visit for strengthening/HEP review and anticipate readiness for d/c next visit.  Pt in agreement with plan.    OT Occupational Profile and History Detailed Assessment- Review of Records and additional review of physical, cognitive, psychosocial history related to current functional performance    Occupational performance deficits (Please refer to evaluation for details): ADL's;IADL's;Leisure;Work    Marketing executive / Function / Physical Skills ADL;Dexterity;Flexibility;ROM;Strength;Coordination;FMC;IADL;Body mechanics;Pain;Sensation;UE functional use    Rehab Potential Good    Clinical Decision Making Several treatment options, min-mod task modification necessary    Comorbidities Affecting Occupational Performance: May have comorbidities impacting occupational performance    Modification or Assistance to Complete Evaluation  No modification of tasks or assist necessary to complete eval    OT Frequency 2x / week    OT Duration 6 weeks    OT Treatment/Interventions Self-care/ADL training;Cryotherapy;Therapeutic exercise;DME and/or AE instruction;Manual Therapy;Splinting;Moist Heat;Passive range of motion;Therapeutic activities;Patient/family education;Coping strategies training    Recommended Other Services may benefit from PT d/t report of balance issues and LE weakness, but pt wants to begin OT and reassess need for PT later    Consulted and Agree with Plan of Care Patient             Patient will benefit from skilled therapeutic intervention in order to improve the following deficits and impairments:   Body Structure /  Function / Physical Skills: ADL, Dexterity, Flexibility, ROM, Strength, Coordination, FMC, IADL, Body mechanics, Pain, Sensation, UE functional use       Visit Diagnosis: Muscle weakness (generalized)  Other lack of coordination    Problem List Patient Active Problem List   Diagnosis Date Noted   H/O appendicitis 07/23/2016   S/P laparoscopic appendectomy 07/23/2016   PTSD (post-traumatic stress disorder) 05/26/2016   Alcoholism and drug addiction in family 05/26/2016  Acute alcoholic hepatitis 07/57/3225   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   Hypertriglyceridemia 05/26/2016   Smoker unmotivated to quit 05/26/2016   Smokeless tobacco use 05/26/2016   Obesity, Class I, BMI 30.0-34.9 (see actual BMI) 05/26/2016   Alcohol use disorder, severe, dependence ([place]) 05/15/2016   Generalized anxiety disorder 10/30/2014   Encounter to establish care 10/30/2014   Encounter for routine gynecological examination 10/30/2014   Routine general medical examination at a health care facility 10/30/2014   Leta Speller, [place], OTR/L  Darleene Cleaver, OT 06/05/2021, 5:04 PM  [place], [place], [address] Phone: [phone]   Fax:  [phone]  Name: Stacey Moreno MRN: [id number] Date of Birth: 05/04/1981

## 2021-06-07 DIAGNOSIS — G35 Multiple sclerosis: Secondary | ICD-10-CM | POA: Diagnosis not present

## 2021-06-07 DIAGNOSIS — M4802 Spinal stenosis, cervical region: Secondary | ICD-10-CM | POA: Diagnosis not present

## 2021-06-07 DIAGNOSIS — M5031 Other cervical disc degeneration,  high cervical region: Secondary | ICD-10-CM | POA: Diagnosis not present

## 2021-06-11 DIAGNOSIS — R202 Paresthesia of skin: Secondary | ICD-10-CM | POA: Diagnosis not present

## 2021-06-11 DIAGNOSIS — G629 Polyneuropathy, unspecified: Secondary | ICD-10-CM | POA: Diagnosis not present

## 2021-06-11 DIAGNOSIS — R208 Other disturbances of skin sensation: Secondary | ICD-10-CM | POA: Diagnosis not present

## 2021-06-11 DIAGNOSIS — R200 Anesthesia of skin: Secondary | ICD-10-CM | POA: Diagnosis not present

## 2021-06-12 ENCOUNTER — Ambulatory Visit: Payer: BC Managed Care – PPO

## 2021-07-23 ENCOUNTER — Ambulatory Visit: Payer: BC Managed Care – PPO | Admitting: Family Medicine

## 2021-08-03 ENCOUNTER — Telehealth: Payer: BC Managed Care – PPO | Admitting: Nurse Practitioner

## 2021-08-03 DIAGNOSIS — U071 COVID-19: Secondary | ICD-10-CM | POA: Diagnosis not present

## 2021-08-03 MED ORDER — MOLNUPIRAVIR EUA 200MG CAPSULE: 4.0000 | ORAL_CAPSULE | Freq: Two times a day (BID) | ORAL | 0 refills | Status: AC

## 2021-08-03 NOTE — Patient Instructions (Signed)
COVID-19: Quarantine and Isolation °Quarantine °If you were exposed °Quarantine and stay away from others when you have been in close contact with someone who has COVID-19. °Isolate °If you are sick or test positive °Isolate when you are sick or when you have COVID-19, even if you don't have symptoms. °When to stay home °Calculating quarantine °The date of your exposure is considered day 0. Day 1 is the first full day after your last contact with a person who has had COVID-19. Stay home and away from other people for at least 5 days. Learn why CDC updated guidance for the general public. °IF YOU were exposed to COVID-19 and are NOT  °up to dateIF YOU were exposed to COVID-19 and are NOT on COVID-19 vaccinations °Quarantine for at least 5 days °Stay home °Stay home and quarantine for at least 5 full days. °Wear a well-fitting mask if you must be around others in your home. °Do not travel. °Get tested °Even if you don't develop symptoms, get tested at least 5 days after you last had close contact with someone with COVID-19. °After quarantine °Watch for symptoms °Watch for symptoms until 10 days after you last had close contact with someone with COVID-19. °Avoid travel °It is best to avoid travel until a full 10 days after you last had close contact with someone with COVID-19. °If you develop symptoms °Isolate immediately and get tested. Continue to stay home until you know the results. Wear a well-fitting mask around others. °Take precautions until day 10 °Wear a well-fitting mask °Wear a well-fitting mask for 10 full days any time you are around others inside your home or in public. Do not go to places where you are unable to wear a well-fitting mask. °If you must travel during days 6-10, take precautions. °Avoid being around people who are more likely to get very sick from COVID-19. °IF YOU were exposed to COVID-19 and are  °up to dateIF YOU were exposed to COVID-19 and are on COVID-19 vaccinations °No  quarantine °You do not need to stay home unless you develop symptoms. °Get tested °Even if you don't develop symptoms, get tested at least 5 days after you last had close contact with someone with COVID-19. °Watch for symptoms °Watch for symptoms until 10 days after you last had close contact with someone with COVID-19. °If you develop symptoms °Isolate immediately and get tested. Continue to stay home until you know the results. Wear a well-fitting mask around others. °Take precautions until day 10 °Wear a well-fitting mask °Wear a well-fitting mask for 10 full days any time you are around others inside your home or in public. Do not go to places where you are unable to wear a well-fitting mask. °Take precautions if traveling °Avoid being around people who are more likely to get very sick from COVID-19. °IF YOU were exposed to COVID-19 and had confirmed COVID-19 within the past 90 days (you tested positive using a viral test) °No quarantine °You do not need to stay home unless you develop symptoms. °Watch for symptoms °Watch for symptoms until 10 days after you last had close contact with someone with COVID-19. °If you develop symptoms °Isolate immediately and get tested. Continue to stay home until you know the results. Wear a well-fitting mask around others. °Take precautions until day 10 °Wear a well-fitting mask °Wear a well-fitting mask for 10 full days any time you are around others inside your home or in public. Do not go to places where you are   unable to wear a well-fitting mask. °Take precautions if traveling °Avoid being around people who are more likely to get very sick from COVID-19. °Calculating isolation °Day 0 is your first day of symptoms or a positive viral test. Day 1 is the first full day after your symptoms developed or your test specimen was collected. If you have COVID-19 or have symptoms, isolate for at least 5 days. °IF YOU tested positive for COVID-19 or have symptoms, regardless of  vaccination status °Stay home for at least 5 days °Stay home for 5 days and isolate from others in your home. °Wear a well-fitting mask if you must be around others in your home. °Do not travel. °Ending isolation if you had symptoms °End isolation after 5 full days if you are fever-free for 24 hours (without the use of fever-reducing medication) and your symptoms are improving. °Ending isolation if you did NOT have symptoms °End isolation after at least 5 full days after your positive test. °If you got very sick from COVID-19 or have a weakened immune system °You should isolate for at least 10 days. Consult your doctor before ending isolation. °Take precautions until day 10 °Wear a well-fitting mask °Wear a well-fitting mask for 10 full days any time you are around others inside your home or in public. Do not go to places where you are unable to wear a well-fitting mask. °Do not travel °Do not travel until a full 10 days after your symptoms started or the date your positive test was taken if you had no symptoms. °Avoid being around people who are more likely to get very sick from COVID-19. °Definitions °Exposure °Contact with someone infected with SARS-CoV-2, the virus that causes COVID-19, in a way that increases the likelihood of getting infected with the virus. °Close contact °A close contact is someone who was less than 6 feet away from an infected person (laboratory-confirmed or a clinical diagnosis) for a cumulative total of 15 minutes or more over a 24-hour period. For example, three individual 5-minute exposures for a total of 15 minutes. People who are exposed to someone with COVID-19 after they completed at least 5 days of isolation are not considered close contacts. °Quarantine °Quarantine is a strategy used to prevent transmission of COVID-19 by keeping people who have been in close contact with someone with COVID-19 apart from others. °Who does not need to quarantine? °If you had close contact with  someone with COVID-19 and you are in one of the following groups, you do not need to quarantine. °You are up to date with your COVID-19 vaccines. °You had confirmed COVID-19 within the last 90 days (meaning you tested positive using a viral test). °If you are up to date with COVID-19 vaccines, you should wear a well-fitting mask around others for 10 days from the date of your last close contact with someone with COVID-19 (the date of last close contact is considered day 0). Get tested at least 5 days after you last had close contact with someone with COVID-19. If you test positive or develop COVID-19 symptoms, isolate from other people and follow recommendations in the Isolation section below. If you tested positive for COVID-19 with a viral test within the previous 90 days and subsequently recovered and remain without COVID-19 symptoms, you do not need to quarantine or get tested after close contact. You should wear a well-fitting mask around others for 10 days from the date of your last close contact with someone with COVID-19 (the date of last   close contact is considered day 0). If you have COVID-19 symptoms, get tested and isolate from other people and follow recommendations in the Isolation section below. °Who should quarantine? °If you come into close contact with someone with COVID-19, you should quarantine if you are not up to date on COVID-19 vaccines. This includes people who are not vaccinated. °What to do for quarantine °Stay home and away from other people for at least 5 days (day 0 through day 5) after your last contact with a person who has COVID-19. The date of your exposure is considered day 0. Wear a well-fitting mask when around others at home, if possible. °For 10 days after your last close contact with someone with COVID-19, watch for fever (100.4°F or greater), cough, shortness of breath, or other COVID-19 symptoms. °If you develop symptoms, get tested immediately and isolate until you receive  your test results. If you test positive, follow isolation recommendations. °If you do not develop symptoms, get tested at least 5 days after you last had close contact with someone with COVID-19. °If you test negative, you can leave your home, but continue to wear a well-fitting mask when around others at home and in public until 10 days after your last close contact with someone with COVID-19. °If you test positive, you should isolate for at least 5 days from the date of your positive test (if you do not have symptoms). If you do develop COVID-19 symptoms, isolate for at least 5 days from the date your symptoms began (the date the symptoms started is day 0). Follow recommendations in the isolation section below. °If you are unable to get a test 5 days after last close contact with someone with COVID-19, you can leave your home after day 5 if you have been without COVID-19 symptoms throughout the 5-day period. Wear a well-fitting mask for 10 days after your date of last close contact when around others at home and in public. °Avoid people who are have weakened immune systems or are more likely to get very sick from COVID-19, and nursing homes and other high-risk settings, until after at least 10 days. °If possible, stay away from people you live with, especially people who are at higher risk for getting very sick from COVID-19, as well as others outside your home throughout the full 10 days after your last close contact with someone with COVID-19. °If you are unable to quarantine, you should wear a well-fitting mask for 10 days when around others at home and in public. °If you are unable to wear a mask when around others, you should continue to quarantine for 10 days. Avoid people who have weakened immune systems or are more likely to get very sick from COVID-19, and nursing homes and other high-risk settings, until after at least 10 days. °See additional information about travel. °Do not go to places where you are  unable to wear a mask, such as restaurants and some gyms, and avoid eating around others at home and at work until after 10 days after your last close contact with someone with COVID-19. °After quarantine °Watch for symptoms until 10 days after your last close contact with someone with COVID-19. °If you have symptoms, isolate immediately and get tested. °Quarantine in high-risk congregate settings °In certain congregate settings that have high risk of secondary transmission (such as correctional and detention facilities, homeless shelters, or cruise ships), CDC recommends a 10-day quarantine for residents, regardless of vaccination and booster status. During periods of critical staffing   shortages, facilities may consider shortening the quarantine period for staff to ensure continuity of operations. Decisions to shorten quarantine in these settings should be made in consultation with state, local, tribal, or territorial health departments and should take into consideration the context and characteristics of the facility. CDC's setting-specific guidance provides additional recommendations for these settings. °Isolation °Isolation is used to separate people with confirmed or suspected COVID-19 from those without COVID-19. People who are in isolation should stay home until it's safe for them to be around others. At home, anyone sick or infected should separate from others, or wear a well-fitting mask when they need to be around others. People in isolation should stay in a specific "sick room" or area and use a separate bathroom if available. Everyone who has presumed or confirmed COVID-19 should stay home and isolate from other people for at least 5 full days (day 0 is the first day of symptoms or the date of the day of the positive viral test for asymptomatic persons). They should wear a mask when around others at home and in public for an additional 5 days. People who are confirmed to have COVID-19 or are showing  symptoms of COVID-19 need to isolate regardless of their vaccination status. This includes: °People who have a positive viral test for COVID-19, regardless of whether or not they have symptoms. °People with symptoms of COVID-19, including people who are awaiting test results or have not been tested. People with symptoms should isolate even if they do not know if they have been in close contact with someone with COVID-19. °What to do for isolation °Monitor your symptoms. If you have an emergency warning sign (including trouble breathing), seek emergency medical care immediately. °Stay in a separate room from other household members, if possible. °Use a separate bathroom, if possible. °Take steps to improve ventilation at home, if possible. °Avoid contact with other members of the household and pets. °Don't share personal household items, like cups, towels, and utensils. °Wear a well-fitting mask when you need to be around other people. °Learn more about what to do if you are sick and how to notify your contacts. °Ending isolation for people who had COVID-19 and had symptoms °If you had COVID-19 and had symptoms, isolate for at least 5 days. To calculate your 5-day isolation period, day 0 is your first day of symptoms. Day 1 is the first full day after your symptoms developed. You can leave isolation after 5 full days. °You can end isolation after 5 full days if you are fever-free for 24 hours without the use of fever-reducing medication and your other symptoms have improved (Loss of taste and smell may persist for weeks or months after recovery and need not delay the end of isolation). °You should continue to wear a well-fitting mask around others at home and in public for 5 additional days (day 6 through day 10) after the end of your 5-day isolation period. If you are unable to wear a mask when around others, you should continue to isolate for a full 10 days. Avoid people who have weakened immune systems or are more  likely to get very sick from COVID-19, and nursing homes and other high-risk settings, until after at least 10 days. °If you continue to have fever or your other symptoms have not improved after 5 days of isolation, you should wait to end your isolation until you are fever-free for 24 hours without the use of fever-reducing medication and your other symptoms have improved.   Continue to wear a well-fitting mask through day 10. Contact your healthcare provider if you have questions. °See additional information about travel. °Do not go to places where you are unable to wear a mask, such as restaurants and some gyms, and avoid eating around others at home and at work until a full 10 days after your first day of symptoms. °If an individual has access to a test and wants to test, the best approach is to use an antigen test1 towards the end of the 5-day isolation period. Collect the test sample only if you are fever-free for 24 hours without the use of fever-reducing medication and your other symptoms have improved (loss of taste and smell may persist for weeks or months after recovery and need not delay the end of isolation). If your test result is positive, you should continue to isolate until day 10. If your test result is negative, you can end isolation, but continue to wear a well-fitting mask around others at home and in public until day 10. Follow additional recommendations for masking and avoiding travel as described above. °1As noted in the labeling for authorized over-the counter antigen tests: Negative results should be treated as presumptive. Negative results do not rule out SARS-CoV-2 infection and should not be used as the sole basis for treatment or patient management decisions, including infection control decisions. To improve results, antigen tests should be used twice over a three-day period with at least 24 hours and no more than 48 hours between tests. °Note that these recommendations on ending isolation  do not apply to people who are moderately ill or very sick from COVID-19 or have weakened immune systems. See section below for recommendations for when to end isolation for these groups. °Ending isolation for people who tested positive for COVID-19 but had no symptoms °If you test positive for COVID-19 and never develop symptoms, isolate for at least 5 days. Day 0 is the day of your positive viral test (based on the date you were tested) and day 1 is the first full day after the specimen was collected for your positive test. You can leave isolation after 5 full days. °If you continue to have no symptoms, you can end isolation after at least 5 days. °You should continue to wear a well-fitting mask around others at home and in public until day 10 (day 6 through day 10). If you are unable to wear a mask when around others, you should continue to isolate for 10 days. Avoid people who have weakened immune systems or are more likely to get very sick from COVID-19, and nursing homes and other high-risk settings, until after at least 10 days. °If you develop symptoms after testing positive, your 5-day isolation period should start over. Day 0 is your first day of symptoms. Follow the recommendations above for ending isolation for people who had COVID-19 and had symptoms. °See additional information about travel. °Do not go to places where you are unable to wear a mask, such as restaurants and some gyms, and avoid eating around others at home and at work until 10 days after the day of your positive test. °If an individual has access to a test and wants to test, the best approach is to use an antigen test1 towards the end of the 5-day isolation period. If your test result is positive, you should continue to isolate until day 10. If your test result is positive, you can also choose to test daily and if your test result   is negative, you can end isolation, but continue to wear a well-fitting mask around others at home and in  public until day 10. Follow additional recommendations for masking and avoiding travel as described above. °1As noted in the labeling for authorized over-the counter antigen tests: Negative results should be treated as presumptive. Negative results do not rule out SARS-CoV-2 infection and should not be used as the sole basis for treatment or patient management decisions, including infection control decisions. To improve results, antigen tests should be used twice over a three-day period with at least 24 hours and no more than 48 hours between tests. °Ending isolation for people who were moderately or very sick from COVID-19 or have a weakened immune system °People who are moderately ill from COVID-19 (experiencing symptoms that affect the lungs like shortness of breath or difficulty breathing) should isolate for 10 days and follow all other isolation precautions. To calculate your 10-day isolation period, day 0 is your first day of symptoms. Day 1 is the first full day after your symptoms developed. If you are unsure if your symptoms are moderate, talk to a healthcare provider for further guidance. °People who are very sick from COVID-19 (this means people who were hospitalized or required intensive care or ventilation support) and people who have weakened immune systems might need to isolate at home longer. They may also require testing with a viral test to determine when they can be around others. CDC recommends an isolation period of at least 10 and up to 20 days for people who were very sick from COVID-19 and for people with weakened immune systems. Consult with your healthcare provider about when you can resume being around other people. If you are unsure if your symptoms are severe or if you have a weakened immune system, talk to a healthcare provider for further guidance. °People who have a weakened immune system should talk to their healthcare provider about the potential for reduced immune responses to  COVID-19 vaccines and the need to continue to follow current prevention measures (including wearing a well-fitting mask and avoiding crowds and poorly ventilated indoor spaces) to protect themselves against COVID-19 until advised otherwise by their healthcare provider. Close contacts of immunocompromised people--including household members--should also be encouraged to receive all recommended COVID-19 vaccine doses to help protect these people. °Isolation in high-risk congregate settings °In certain high-risk congregate settings that have high risk of secondary transmission and where it is not feasible to cohort people (such as correctional and detention facilities, homeless shelters, and cruise ships), CDC recommends a 10-day isolation period for residents. During periods of critical staffing shortages, facilities may consider shortening the isolation period for staff to ensure continuity of operations. Decisions to shorten isolation in these settings should be made in consultation with state, local, tribal, or territorial health departments and should take into consideration the context and characteristics of the facility. CDC's setting-specific guidance provides additional recommendations for these settings. °This CDC guidance is meant to supplement--not replace--any federal, state, local, territorial, or tribal health and safety laws, rules, and regulations. °Recommendations for specific settings °These recommendations do not apply to healthcare professionals. For guidance specific to these settings, see °Healthcare professionals: Interim Guidance for Managing Healthcare Personnel with SARS-CoV-2 Infection or Exposure to SARS-CoV-2 °Patients, residents, and visitors to healthcare settings: Interim Infection Prevention and Control Recommendations for Healthcare Personnel During the Coronavirus Disease 2019 (COVID-19) Pandemic °Additional setting-specific guidance and recommendations are available. °These  recommendations on quarantine and isolation do apply to [employer] School   settings. Additional guidance is available here: Overview of COVID-19 Quarantine for K-12 Schools °Travelers: Travel information and recommendations °Congregate facilities and other settings: guidance pages for community, work, and school settings °Ongoing COVID-19 exposure FAQs °I live with someone with COVID-19, but I cannot be separated from them. How do we manage quarantine in this situation? °It is very important for people with COVID-19 to remain apart from other people, if possible, even if they are living together. If separation of the person with COVID-19 from others that they live with is not possible, the other people that they live with will have ongoing exposure, meaning they will be repeatedly exposed until that person is no longer able to spread the virus to other people. In this situation, there are precautions you can take to limit the spread of COVID-19: °The person with COVID-19 and everyone they live with should wear a well-fitting mask inside the home. °If possible, one person should care for the person with COVID-19 to limit the number of people who are in close contact with the infected person. °Take steps to protect yourself and others to reduce transmission in the home: °Quarantine if you are not up to date with your COVID-19 vaccines. °Isolate if you are sick or tested positive for COVID-19, even if you don't have symptoms. °Learn more about the public health recommendations for testing, mask use and quarantine of close contacts, like yourself, who have ongoing exposure. These recommendations differ depending on your vaccination status. °What should I do if I have ongoing exposure to COVID-19 from someone I live with? °Recommendations for this situation depend on your vaccination status: °If you are not up to date on COVID-19 vaccines and have ongoing exposure to COVID-19, you should: °Begin quarantine immediately and  continue to quarantine throughout the isolation period of the person with COVID-19. °Continue to quarantine for an additional 5 days starting the day after the end of isolation for the person with COVID-19. °Get tested at least 5 days after the end of isolation of the infected person that lives with them. °If you test negative, you can leave the home but should continue to wear a well-fitting mask when around others at home and in public until 10 days after the end of isolation for the person with COVID-19. °Isolate immediately if you develop symptoms of COVID-19 or test positive. °If you are up to date with COVID-19 vaccines and have ongoing exposure to COVID-19, you should: °Get tested at least 5 days after your first exposure. A person with COVID-19 is considered infectious starting 2 days before they develop symptoms, or 2 days before the date of their positive test if they do not have symptoms. °Get tested again at least 5 days after the end of isolation for the person with COVID-19. °Wear a well-fitting mask when you are around the person with COVID-19, and do this throughout their isolation period. °Wear a well-fitting mask around others for 10 days after the infected person's isolation period ends. °Isolate immediately if you develop symptoms of COVID-19 or test positive. °What should I do if multiple people I live with test positive for COVID-19 at different times? °Recommendations for this situation depend on your vaccination status: °If you are not up to date with your COVID-19 vaccines, you should: °Quarantine throughout the isolation period of any infected person that you live with. °Continue to quarantine until 5 days after the end of isolation date for the most recently infected person that lives with you. For example, if   the last day of isolation of the person most recently infected with COVID-19 was June 30, the new 5-day quarantine period starts on July 1. °Get tested at least 5 days after the end  of isolation for the most recently infected person that lives with you. °Wear a well-fitting mask when you are around any person with COVID-19 while that person is in isolation. °Wear a well-fitting mask when you are around other people until 10 days after your last close contact. °Isolate immediately if you develop symptoms of COVID-19 or test positive. °If you are up to date with your COVID-19 vaccines, you should: °Get tested at least 5 days after your first exposure. A person with COVID-19 is considered infectious starting 2 days before they developed symptoms, or 2 days before the date of their positive test if they do not have symptoms. °Get tested again at least 5 days after the end of isolation for the most recently infected person that lives with you. °Wear a well-fitting mask when you are around any person with COVID-19 while that person is in isolation. °Wear a well-fitting mask around others for 10 days after the end of isolation for the most recently infected person that lives with you. For example, if the last day of isolation for the person most recently infected with COVID-19 was June 30, the new 10-day period to wear a well-fitting mask indoors in public starts on July 1. °Isolate immediately if you develop symptoms of COVID-19 or test positive. °I had COVID-19 and completed isolation. Do I have to quarantine or get tested if someone I live with gets COVID-19 shortly after I completed isolation? °No. If you recently completed isolation and someone that lives with you tests positive for the virus that causes COVID-19 shortly after the end of your isolation period, you do not have to quarantine or get tested as long as you do not develop new symptoms. Once all of the people that live together have completed isolation or quarantine, refer to the guidance below for new exposures to COVID-19. °If you had COVID-19 in the previous 90 days and then came into close contact with someone with COVID-19, you do  not have to quarantine or get tested if you do not have symptoms. But you should: °Wear a well-fitting mask indoors in public for 10 days after your last close contact. °Monitor for COVID-19 symptoms for 10 days from the date of your last close contact. °Isolate immediately and get tested if symptoms develop. °If more than 90 days have passed since your recovery from infection, follow CDC's recommendations for close contacts. These recommendations will differ depending on your vaccination status. °09/26/2020 °Content source: National Center for Immunization and Respiratory Diseases (NCIRD), Division of Viral Diseases °This information is not intended to replace advice given to you by your health care provider. Make sure you discuss any questions you have with your health care provider. °Document Revised: 01/30/2021 Document Reviewed: 01/30/2021 °Elsevier Patient Education © 2022 [employer] ° °

## 2021-08-03 NOTE — Progress Notes (Signed)
Virtual Visit Consent   Stacey Moreno, you are scheduled for a virtual visit with Mary-Margaret Hassell Done, [place], a [facility] provider, today.     Just as with appointments in the office, your consent must be obtained to participate.  Your consent will be active for this visit and any virtual visit you may have with one of our providers in the next 365 days.     If you have a MyChart account, a copy of this consent can be sent to you electronically.  All virtual visits are billed to your insurance company just like a traditional visit in the office.    As this is a virtual visit, video technology does not allow for your provider to perform a traditional examination.  This may limit your provider's ability to fully assess your condition.  If your provider identifies any concerns that need to be evaluated in person or the need to arrange testing (such as labs, EKG, etc.), we will make arrangements to do so.     Although advances in technology are sophisticated, we cannot ensure that it will always work on either your end or our end.  If the connection with a video visit is poor, the visit may have to be switched to a telephone visit.  With either a video or telephone visit, we are not always able to ensure that we have a secure connection.     I need to obtain your verbal consent now.   Are you willing to proceed with your visit today? YES   BARRI NEIDLINGER has provided verbal consent on 08/03/2021 for a virtual visit (video or telephone).   Mary-Margaret Hassell Done, FNP   Date: 08/03/2021 9:34 AM   Virtual Visit via Video Note   I, Mary-Margaret Hassell Done, connected with AMOR HYLE ([id number], 08-Mar-1981) on 08/03/21 at  9:45 AM EST by a video-enabled telemedicine application and verified that I am speaking with the correct person using two identifiers.  Location: Patient: Virtual Visit Location Patient: Home Provider: Virtual Visit Location Provider: Mobile   I discussed the limitations of  evaluation and management by telemedicine and the availability of in person appointments. The patient expressed understanding and agreed to proceed.    History of Present Illness: Stacey Moreno is a 41 y.o. who identifies as a female who was assigned female at birth, and is being seen today for covid positive.  HPI: URI  This is a new problem. The current episode started yesterday. The problem has been gradually worsening. There has been no fever. Associated symptoms include congestion, coughing, headaches and rhinorrhea. Pertinent negatives include no sore throat. She has tried decongestant for the symptoms. The treatment provided mild relief.  Tested positive for covid 1 hour ago. Review of Systems  HENT:  Positive for congestion and rhinorrhea. Negative for sore throat.   Respiratory:  Positive for cough.   Neurological:  Positive for headaches.   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/2016   Hypertriglyceridemia 05/26/2016   Smoker unmotivated to quit 05/26/2016   Smokeless tobacco use 05/26/2016   Obesity, Class I, BMI 30.0-34.9 (see actual BMI) 05/26/2016   Alcohol use disorder, severe, dependence ([place]) 05/15/2016   Generalized anxiety disorder 10/30/2014   Encounter  to establish care 10/30/2014   Encounter for routine gynecological examination 10/30/2014   Routine general medical examination at a health care facility 10/30/2014    Allergies: No Known Allergies Medications:  Current Outpatient Medications:    benzonatate (TESSALON) 100 MG capsule, Take 2 capsules (200 mg total) by mouth every 8 (eight) hours. (Patient not taking: Reported on 05/02/2021), Disp: 21 capsule, Rfl: 0   ibuprofen  (ADVIL,MOTRIN) 600 MG tablet, Take 1 tablet (600 mg total) by mouth every 8 (eight) hours as needed for fever or mild pain., Disp: 30 tablet, Rfl: 0   ipratropium (ATROVENT) 0.06 % nasal spray, Place 2 sprays into both nostrils 4 (four) times daily. (Patient not taking: Reported on 05/02/2021), Disp: 15 mL, Rfl: 12   Lysine 500 MG TABS, Take 6 tablets by mouth daily., Disp: , Rfl:    nortriptyline (PAMELOR) 10 MG capsule, Take 10 mg by mouth at bedtime. 10 mg at night for the first week, then increase to 20 mg at night time, Disp: , Rfl:    omeprazole (PRILOSEC) 20 MG capsule, TAKE 1 CAPSULE(20 MG) BY MOUTH DAILY, Disp: 90 capsule, Rfl: 3   ondansetron (ZOFRAN ODT) 4 MG disintegrating tablet, Take 1 tablet (4 mg total) by mouth every 8 (eight) hours as needed for nausea or vomiting. (Patient not taking: Reported on 05/02/2021), Disp: 30 tablet, Rfl: 0   pregabalin (LYRICA) 100 MG capsule, Take 100 mg by mouth in the morning, at noon, and at bedtime., Disp: , Rfl:    promethazine-dextromethorphan (PROMETHAZINE-DM) 6.25-15 MG/5ML syrup, Take 5 mLs by mouth 4 (four) times daily as needed. (Patient not taking: Reported on 05/02/2021), Disp: 118 mL, Rfl: 0   sertraline (ZOLOFT) 100 MG tablet, Take 100 mg by mouth daily., Disp: , Rfl: 0  Observations/Objective: Patient is well-developed, well-nourished in no acute distress.  Resting comfortably  at home.  Head is normocephalic, atraumatic.  No labored breathing.  Speech is clear and coherent with logical content.  Patient is alert and oriented at baseline.  Raspy voice Wet cough  Assessment and Plan:  Stacey Moreno in today with chief complaint of Covid Positive   1. Positive self-administered antigen test for COVID-19 1. Take meds as prescribed 2. Use a cool mist humidifier especially during the winter months and when heat has been humid. 3. Use saline nose sprays frequently 4. Saline irrigations of the nose can be very helpful if done  frequently.  * 4X daily for 1 week*  * Use of a nettie pot can be helpful with this. Follow directions with this* 5. Drink plenty of fluids 6. Keep thermostat turn down low 7.For any cough or congestion- mucinex or delsym 8. For fever or aces or pains- take tylenol or ibuprofen appropriate for age and weight.  * for fevers greater than 101 orally you may alternate ibuprofen and tylenol every  3 hours.   Meds ordered this encounter  Medications   molnupiravir EUA (LAGEVRIO) 200 mg CAPS capsule    Sig: Take 4 capsules (800 mg total) by mouth 2 (two) times daily for 5 days.    Dispense:  40 capsule    Refill:  0    Order Specific Question:   Supervising Provider    Answer:   Noemi Chapel [[id number]]     Follow Up Instructions: I discussed the assessment and treatment plan with the patient. The patient was provided an opportunity to ask questions and all were answered. The patient agreed with the plan and  demonstrated an understanding of the instructions.  A copy of instructions were sent to the patient via MyChart.  The patient was advised to call back or seek an in-person evaluation if the symptoms worsen or if the condition fails to improve as anticipated.  Time:  I spent 8 minutes with the patient via telehealth technology discussing the above problems/concerns.    Mary-Margaret Hassell Done, FNP

## 2021-09-17 DIAGNOSIS — R200 Anesthesia of skin: Secondary | ICD-10-CM | POA: Diagnosis not present

## 2021-09-17 DIAGNOSIS — R208 Other disturbances of skin sensation: Secondary | ICD-10-CM | POA: Diagnosis not present

## 2021-09-17 DIAGNOSIS — R202 Paresthesia of skin: Secondary | ICD-10-CM | POA: Diagnosis not present

## 2021-09-17 DIAGNOSIS — G629 Polyneuropathy, unspecified: Secondary | ICD-10-CM | POA: Diagnosis not present

## 2022-02-28 DIAGNOSIS — F330 Major depressive disorder, recurrent, mild: Secondary | ICD-10-CM | POA: Diagnosis not present

## 2022-02-28 DIAGNOSIS — F419 Anxiety disorder, unspecified: Secondary | ICD-10-CM | POA: Diagnosis not present

## 2022-02-28 DIAGNOSIS — R41840 Attention and concentration deficit: Secondary | ICD-10-CM | POA: Diagnosis not present

## 2022-02-28 DIAGNOSIS — F1021 Alcohol dependence, in remission: Secondary | ICD-10-CM | POA: Diagnosis not present

## 2022-03-13 DIAGNOSIS — F1021 Alcohol dependence, in remission: Secondary | ICD-10-CM | POA: Diagnosis not present

## 2022-03-13 DIAGNOSIS — F330 Major depressive disorder, recurrent, mild: Secondary | ICD-10-CM | POA: Diagnosis not present

## 2022-03-13 DIAGNOSIS — R41840 Attention and concentration deficit: Secondary | ICD-10-CM | POA: Diagnosis not present

## 2022-03-13 DIAGNOSIS — F419 Anxiety disorder, unspecified: Secondary | ICD-10-CM | POA: Diagnosis not present

## 2022-03-25 DIAGNOSIS — R41840 Attention and concentration deficit: Secondary | ICD-10-CM | POA: Diagnosis not present

## 2022-03-25 DIAGNOSIS — F1021 Alcohol dependence, in remission: Secondary | ICD-10-CM | POA: Diagnosis not present

## 2022-03-25 DIAGNOSIS — F330 Major depressive disorder, recurrent, mild: Secondary | ICD-10-CM | POA: Diagnosis not present

## 2022-03-25 DIAGNOSIS — F419 Anxiety disorder, unspecified: Secondary | ICD-10-CM | POA: Diagnosis not present

## 2022-05-06 DIAGNOSIS — F1021 Alcohol dependence, in remission: Secondary | ICD-10-CM | POA: Diagnosis not present

## 2022-05-06 DIAGNOSIS — F419 Anxiety disorder, unspecified: Secondary | ICD-10-CM | POA: Diagnosis not present

## 2022-05-06 DIAGNOSIS — R41840 Attention and concentration deficit: Secondary | ICD-10-CM | POA: Diagnosis not present

## 2022-05-06 DIAGNOSIS — F330 Major depressive disorder, recurrent, mild: Secondary | ICD-10-CM | POA: Diagnosis not present

## 2022-08-04 DIAGNOSIS — G629 Polyneuropathy, unspecified: Secondary | ICD-10-CM | POA: Diagnosis not present

## 2022-08-04 DIAGNOSIS — R202 Paresthesia of skin: Secondary | ICD-10-CM | POA: Diagnosis not present

## 2022-08-04 DIAGNOSIS — R200 Anesthesia of skin: Secondary | ICD-10-CM | POA: Diagnosis not present

## 2022-12-05 DIAGNOSIS — F902 Attention-deficit hyperactivity disorder, combined type: Secondary | ICD-10-CM | POA: Diagnosis not present

## 2022-12-30 DIAGNOSIS — F902 Attention-deficit hyperactivity disorder, combined type: Secondary | ICD-10-CM | POA: Diagnosis not present

## 2023-02-02 DIAGNOSIS — R29818 Other symptoms and signs involving the nervous system: Secondary | ICD-10-CM | POA: Diagnosis not present

## 2023-02-02 DIAGNOSIS — R200 Anesthesia of skin: Secondary | ICD-10-CM | POA: Diagnosis not present

## 2023-02-02 DIAGNOSIS — G629 Polyneuropathy, unspecified: Secondary | ICD-10-CM | POA: Diagnosis not present

## 2023-02-02 DIAGNOSIS — R202 Paresthesia of skin: Secondary | ICD-10-CM | POA: Diagnosis not present

## 2023-02-09 ENCOUNTER — Other Ambulatory Visit (INDEPENDENT_AMBULATORY_CARE_PROVIDER_SITE_OTHER): Payer: Self-pay | Admitting: Student

## 2023-02-09 DIAGNOSIS — R2689 Other abnormalities of gait and mobility: Secondary | ICD-10-CM

## 2023-02-09 DIAGNOSIS — G629 Polyneuropathy, unspecified: Secondary | ICD-10-CM

## 2023-02-09 DIAGNOSIS — R29818 Other symptoms and signs involving the nervous system: Secondary | ICD-10-CM

## 2023-03-23 ENCOUNTER — Ambulatory Visit (INDEPENDENT_AMBULATORY_CARE_PROVIDER_SITE_OTHER): Payer: Self-pay

## 2023-03-23 DIAGNOSIS — R2689 Other abnormalities of gait and mobility: Secondary | ICD-10-CM

## 2023-03-23 DIAGNOSIS — G629 Polyneuropathy, unspecified: Secondary | ICD-10-CM

## 2023-03-23 DIAGNOSIS — R29818 Other symptoms and signs involving the nervous system: Secondary | ICD-10-CM

## 2023-04-01 DIAGNOSIS — F902 Attention-deficit hyperactivity disorder, combined type: Secondary | ICD-10-CM | POA: Diagnosis not present

## 2023-04-01 DIAGNOSIS — F32A Depression, unspecified: Secondary | ICD-10-CM | POA: Diagnosis not present

## 2023-05-05 DIAGNOSIS — F902 Attention-deficit hyperactivity disorder, combined type: Secondary | ICD-10-CM | POA: Diagnosis not present

## 2023-11-17 ENCOUNTER — Encounter (INDEPENDENT_AMBULATORY_CARE_PROVIDER_SITE_OTHER): Payer: Self-pay
